# Patient Record
Sex: Female | Born: 1970
Health system: Southern US, Community
[De-identification: ages and names within clinical notes are randomized; demographics above are authoritative.]

## PROBLEM LIST (undated history)

## (undated) HISTORY — PX: OTHER SURGICAL HISTORY: SHX169

## (undated) HISTORY — PX: CHOLECYSTECTOMY: SHX55

## (undated) HISTORY — PX: ABDOMINAL HYSTERECTOMY: SHX81

---

## 2013-11-08 ENCOUNTER — Other Ambulatory Visit: Payer: Self-pay

## 2013-11-08 DIAGNOSIS — Z1239 Encounter for other screening for malignant neoplasm of breast: Secondary | ICD-10-CM

## 2013-11-10 ENCOUNTER — Ambulatory Visit
Admission: RE | Admit: 2013-11-10 | Discharge: 2013-11-10 | Disposition: A | Discharge status: Home / Self Care | Payer: Managed Care, Other (non HMO) | Source: Ambulatory Visit

## 2013-11-10 ENCOUNTER — Encounter (INDEPENDENT_AMBULATORY_CARE_PROVIDER_SITE_OTHER): Payer: Self-pay

## 2013-11-10 DIAGNOSIS — Z1239 Encounter for other screening for malignant neoplasm of breast: Secondary | ICD-10-CM

## 2013-11-13 ENCOUNTER — Other Ambulatory Visit: Payer: Self-pay | Admitting: Family Medicine

## 2013-11-13 DIAGNOSIS — R928 Other abnormal and inconclusive findings on diagnostic imaging of breast: Secondary | ICD-10-CM

## 2013-12-04 ENCOUNTER — Ambulatory Visit
Admission: RE | Admit: 2013-12-04 | Discharge: 2013-12-04 | Disposition: A | Discharge status: Home / Self Care | Payer: Managed Care, Other (non HMO) | Source: Ambulatory Visit | Attending: Family Medicine | Admitting: Family Medicine

## 2013-12-04 DIAGNOSIS — R928 Other abnormal and inconclusive findings on diagnostic imaging of breast: Secondary | ICD-10-CM

## 2013-12-28 ENCOUNTER — Other Ambulatory Visit (HOSPITAL_COMMUNITY)
Admission: RE | Admit: 2013-12-28 | Discharge: 2013-12-28 | Disposition: A | Discharge status: Home / Self Care | Payer: Managed Care, Other (non HMO) | Source: Ambulatory Visit | Attending: Family Medicine | Admitting: Family Medicine

## 2013-12-28 ENCOUNTER — Other Ambulatory Visit: Payer: Self-pay | Admitting: Family Medicine

## 2013-12-28 DIAGNOSIS — Z1151 Encounter for screening for human papillomavirus (HPV): Secondary | ICD-10-CM | POA: Insufficient documentation

## 2013-12-28 DIAGNOSIS — Z124 Encounter for screening for malignant neoplasm of cervix: Secondary | ICD-10-CM | POA: Diagnosis present

## 2014-01-01 LAB — CYTOLOGY - PAP

## 2015-04-11 ENCOUNTER — Other Ambulatory Visit: Payer: Self-pay

## 2015-04-11 DIAGNOSIS — Z1231 Encounter for screening mammogram for malignant neoplasm of breast: Secondary | ICD-10-CM

## 2015-04-16 ENCOUNTER — Ambulatory Visit
Admission: RE | Admit: 2015-04-16 | Discharge: 2015-04-16 | Disposition: A | Discharge status: Home / Self Care | Payer: Managed Care, Other (non HMO) | Source: Ambulatory Visit

## 2015-04-16 DIAGNOSIS — Z1231 Encounter for screening mammogram for malignant neoplasm of breast: Secondary | ICD-10-CM

## 2016-05-29 ENCOUNTER — Other Ambulatory Visit: Payer: Self-pay | Admitting: Family Medicine

## 2016-05-29 DIAGNOSIS — Z1231 Encounter for screening mammogram for malignant neoplasm of breast: Secondary | ICD-10-CM

## 2016-06-19 ENCOUNTER — Ambulatory Visit: Payer: Managed Care, Other (non HMO)

## 2016-06-29 ENCOUNTER — Ambulatory Visit
Admission: RE | Admit: 2016-06-29 | Discharge: 2016-06-29 | Disposition: A | Discharge status: Home / Self Care | Payer: Managed Care, Other (non HMO) | Source: Ambulatory Visit | Attending: Family Medicine | Admitting: Family Medicine

## 2016-06-29 DIAGNOSIS — Z1231 Encounter for screening mammogram for malignant neoplasm of breast: Secondary | ICD-10-CM

## 2017-01-23 ENCOUNTER — Inpatient Hospital Stay (HOSPITAL_BASED_OUTPATIENT_CLINIC_OR_DEPARTMENT_OTHER)
Admission: EM | Admit: 2017-01-23 | Discharge: 2017-01-30 | DRG: 441 | Disposition: A | Discharge status: Home Health | Payer: BLUE CROSS/BLUE SHIELD | Attending: Internal Medicine | Admitting: Internal Medicine

## 2017-01-23 ENCOUNTER — Emergency Department (HOSPITAL_BASED_OUTPATIENT_CLINIC_OR_DEPARTMENT_OTHER): Payer: BLUE CROSS/BLUE SHIELD

## 2017-01-23 ENCOUNTER — Other Ambulatory Visit: Payer: Self-pay

## 2017-01-23 ENCOUNTER — Encounter (HOSPITAL_BASED_OUTPATIENT_CLINIC_OR_DEPARTMENT_OTHER): Payer: Self-pay | Admitting: *Deleted

## 2017-01-23 DIAGNOSIS — D473 Essential (hemorrhagic) thrombocythemia: Secondary | ICD-10-CM | POA: Diagnosis not present

## 2017-01-23 DIAGNOSIS — R509 Fever, unspecified: Secondary | ICD-10-CM

## 2017-01-23 DIAGNOSIS — R599 Enlarged lymph nodes, unspecified: Secondary | ICD-10-CM | POA: Diagnosis not present

## 2017-01-23 DIAGNOSIS — Z880 Allergy status to penicillin: Secondary | ICD-10-CM

## 2017-01-23 DIAGNOSIS — K831 Obstruction of bile duct: Secondary | ICD-10-CM | POA: Diagnosis present

## 2017-01-23 DIAGNOSIS — Z79899 Other long term (current) drug therapy: Secondary | ICD-10-CM | POA: Diagnosis not present

## 2017-01-23 DIAGNOSIS — G8929 Other chronic pain: Secondary | ICD-10-CM | POA: Diagnosis present

## 2017-01-23 DIAGNOSIS — E876 Hypokalemia: Secondary | ICD-10-CM | POA: Diagnosis not present

## 2017-01-23 DIAGNOSIS — R52 Pain, unspecified: Secondary | ICD-10-CM | POA: Diagnosis not present

## 2017-01-23 DIAGNOSIS — D638 Anemia in other chronic diseases classified elsewhere: Secondary | ICD-10-CM | POA: Diagnosis not present

## 2017-01-23 DIAGNOSIS — D72829 Elevated white blood cell count, unspecified: Secondary | ICD-10-CM | POA: Diagnosis not present

## 2017-01-23 DIAGNOSIS — A498 Other bacterial infections of unspecified site: Secondary | ICD-10-CM | POA: Diagnosis not present

## 2017-01-23 DIAGNOSIS — Z888 Allergy status to other drugs, medicaments and biological substances status: Secondary | ICD-10-CM | POA: Diagnosis not present

## 2017-01-23 DIAGNOSIS — R5383 Other fatigue: Secondary | ICD-10-CM

## 2017-01-23 DIAGNOSIS — D649 Anemia, unspecified: Secondary | ICD-10-CM | POA: Diagnosis not present

## 2017-01-23 DIAGNOSIS — E871 Hypo-osmolality and hyponatremia: Secondary | ICD-10-CM | POA: Diagnosis not present

## 2017-01-23 DIAGNOSIS — F419 Anxiety disorder, unspecified: Secondary | ICD-10-CM | POA: Diagnosis not present

## 2017-01-23 DIAGNOSIS — K750 Abscess of liver: Secondary | ICD-10-CM | POA: Diagnosis not present

## 2017-01-23 DIAGNOSIS — F329 Major depressive disorder, single episode, unspecified: Secondary | ICD-10-CM | POA: Diagnosis present

## 2017-01-23 DIAGNOSIS — R7989 Other specified abnormal findings of blood chemistry: Secondary | ICD-10-CM | POA: Diagnosis present

## 2017-01-23 DIAGNOSIS — E669 Obesity, unspecified: Secondary | ICD-10-CM | POA: Diagnosis present

## 2017-01-23 DIAGNOSIS — L270 Generalized skin eruption due to drugs and medicaments taken internally: Secondary | ICD-10-CM

## 2017-01-23 DIAGNOSIS — Z885 Allergy status to narcotic agent status: Secondary | ICD-10-CM

## 2017-01-23 DIAGNOSIS — K9189 Other postprocedural complications and disorders of digestive system: Secondary | ICD-10-CM | POA: Diagnosis present

## 2017-01-23 DIAGNOSIS — Z6834 Body mass index (BMI) 34.0-34.9, adult: Secondary | ICD-10-CM | POA: Diagnosis not present

## 2017-01-23 DIAGNOSIS — N739 Female pelvic inflammatory disease, unspecified: Secondary | ICD-10-CM | POA: Diagnosis not present

## 2017-01-23 DIAGNOSIS — Z881 Allergy status to other antibiotic agents status: Secondary | ICD-10-CM | POA: Diagnosis not present

## 2017-01-23 DIAGNOSIS — K219 Gastro-esophageal reflux disease without esophagitis: Secondary | ICD-10-CM | POA: Diagnosis present

## 2017-01-23 DIAGNOSIS — K769 Liver disease, unspecified: Secondary | ICD-10-CM | POA: Diagnosis not present

## 2017-01-23 DIAGNOSIS — Z90710 Acquired absence of both cervix and uterus: Secondary | ICD-10-CM

## 2017-01-23 DIAGNOSIS — K760 Fatty (change of) liver, not elsewhere classified: Secondary | ICD-10-CM | POA: Diagnosis not present

## 2017-01-23 DIAGNOSIS — Z9889 Other specified postprocedural states: Secondary | ICD-10-CM | POA: Diagnosis not present

## 2017-01-23 LAB — URINALYSIS, ROUTINE W REFLEX MICROSCOPIC
Glucose, UA: NEGATIVE mg/dL
KETONES UR: NEGATIVE mg/dL
Leukocytes, UA: NEGATIVE
NITRITE: NEGATIVE
PROTEIN: 30 mg/dL — AB
Specific Gravity, Urine: 1.01 (ref 1.005–1.030)
pH: 6 (ref 5.0–8.0)

## 2017-01-23 LAB — CBC WITH DIFFERENTIAL/PLATELET
Basophils Absolute: 0 10*3/uL (ref 0.0–0.1)
Basophils Relative: 0 %
EOS PCT: 0 %
Eosinophils Absolute: 0 10*3/uL (ref 0.0–0.7)
HEMATOCRIT: 27.4 % — AB (ref 36.0–46.0)
HEMOGLOBIN: 9.5 g/dL — AB (ref 12.0–15.0)
LYMPHS PCT: 13 %
Lymphs Abs: 2.6 10*3/uL (ref 0.7–4.0)
MCH: 28.5 pg (ref 26.0–34.0)
MCHC: 34.7 g/dL (ref 30.0–36.0)
MCV: 82.3 fL (ref 78.0–100.0)
MONOS PCT: 6 %
Monocytes Absolute: 1.2 10*3/uL — ABNORMAL HIGH (ref 0.1–1.0)
NEUTROS ABS: 16.4 10*3/uL — AB (ref 1.7–7.7)
Neutrophils Relative %: 81 %
Platelets: 383 10*3/uL (ref 150–400)
RBC: 3.33 MIL/uL — ABNORMAL LOW (ref 3.87–5.11)
RDW: 13.3 % (ref 11.5–15.5)
WBC: 20.2 10*3/uL — ABNORMAL HIGH (ref 4.0–10.5)

## 2017-01-23 LAB — COMPREHENSIVE METABOLIC PANEL
ALK PHOS: 119 U/L (ref 38–126)
ALT: 71 U/L — ABNORMAL HIGH (ref 14–54)
AST: 68 U/L — AB (ref 15–41)
Albumin: 2.2 g/dL — ABNORMAL LOW (ref 3.5–5.0)
Anion gap: 12 (ref 5–15)
BUN: 13 mg/dL (ref 6–20)
CALCIUM: 7.6 mg/dL — AB (ref 8.9–10.3)
CO2: 22 mmol/L (ref 22–32)
Chloride: 98 mmol/L — ABNORMAL LOW (ref 101–111)
Creatinine, Ser: 0.65 mg/dL (ref 0.44–1.00)
GFR calc Af Amer: >60 mL/min (ref >60)
GFR calc non Af Amer: >60 mL/min (ref >60)
GLUCOSE: 86 mg/dL (ref 65–99)
Potassium: 2.9 mmol/L — ABNORMAL LOW (ref 3.5–5.1)
Sodium: 132 mmol/L — ABNORMAL LOW (ref 135–145)
Total Bilirubin: 2.6 mg/dL — ABNORMAL HIGH (ref 0.3–1.2)
Total Protein: 7 g/dL (ref 6.5–8.1)

## 2017-01-23 LAB — URINALYSIS, MICROSCOPIC (REFLEX)

## 2017-01-23 LAB — LIPASE, BLOOD: Lipase: 46 U/L (ref 11–51)

## 2017-01-23 MED ORDER — METRONIDAZOLE IN NACL 5-0.79 MG/ML-% IV SOLN
500.0000 mg | Freq: Once | INTRAVENOUS | Status: AC
  Administered 2017-01-24: 500 mg via INTRAVENOUS
  Filled 2017-01-23: qty 100

## 2017-01-23 MED ORDER — DEXTROSE 5 % IV SOLN
2.0000 g | Freq: Once | INTRAVENOUS | Status: AC
  Administered 2017-01-23: 2 g via INTRAVENOUS
  Filled 2017-01-23: qty 2

## 2017-01-23 MED ORDER — ACETAMINOPHEN 325 MG PO TABS
650.0000 mg | ORAL_TABLET | Freq: Once | ORAL | Status: AC
  Administered 2017-01-23: 650 mg via ORAL
  Filled 2017-01-23: qty 2

## 2017-01-23 MED ORDER — SODIUM CHLORIDE 0.9 % IV BOLUS (SEPSIS)
1000.0000 mL | Freq: Once | INTRAVENOUS | Status: AC
  Administered 2017-01-23: 1000 mL via INTRAVENOUS

## 2017-01-23 MED ORDER — POTASSIUM CHLORIDE 10 MEQ/100ML IV SOLN
10.0000 meq | INTRAVENOUS | Status: AC
  Administered 2017-01-24 (×3): 10 meq via INTRAVENOUS
  Filled 2017-01-23 (×3): qty 100

## 2017-01-23 MED ORDER — IOPAMIDOL (ISOVUE-300) INJECTION 61%
100.0000 mL | Freq: Once | INTRAVENOUS | Status: AC | PRN
  Administered 2017-01-23: 100 mL via INTRAVENOUS

## 2017-01-23 NOTE — ED Notes (Signed)
Patient transported to CT 

## 2017-01-23 NOTE — ED Triage Notes (Signed)
Patient states she has a two week history of abdominal pain, decreased appetite, increased thirst, increased urination and fatigue.Also she has had periods of extreme sweats followed by extreme cold and a non productive cough. Recently was on a cruise to [place].  States twenty years ago she had her gallbladder removed and her common bile duct stapled.  Had to have it rebuild at [facility].  States she has had episodes like this over the years, none in five years, and usually is associated with stress.  States she has a new job and it is stressful.

## 2017-01-23 NOTE — ED Notes (Signed)
Patient ambulatory to the BR without difficulty 

## 2017-01-23 NOTE — ED Provider Notes (Signed)
[facility] EMERGENCY DEPARTMENT Provider Note   CSN: [id number] Arrival date & time: 01/23/17  1844     History   Chief Complaint Chief Complaint  Patient presents with  . Fatigue    HPI Molly Baker is a 46 y.o. female.  HPI   Reports hx of abdominal cramping with stress since she had biliary tube rebuilt. Last night and the night before had an attack. Usually it goes away and feels better but this time had shivers and sweating.  Has new job, thinks it is part of the stress.  Went on vacation last week and it has progressively not been getting better. Reports severe fatigue. Stomach feels nauseas, fruit, water, lemon ice for a few days.  Ate a meal yesterday but it was not much.  It's just not getting better.  Pain is epigastric, like "punching", now when eating it shoots bilaterally.  Pain is not severe but feeling.  Urine is yellow-orange, frequent urination.  Shivers improved now.    Started to have cough in back of the throat.  Feels like tickle in the back of the throat, not like a true cough.     Fever in ED, not sure how long has had it, chills have been going on for the last 2 weeks. Biggest symptom today is fatigue, general feeling of unwell. Epigastric discomfort.  Had vomiting prior but not recently. No diarrhea.    History reviewed. No pertinent past medical history.  Patient Active Problem List   Diagnosis Date Noted  . Hepatic abscess 01/23/2017    Past Surgical History:  Procedure Laterality Date  . ABDOMINAL HYSTERECTOMY     partial  . CHOLECYSTECTOMY    . common bile duct reconstruction      OB History    No data available       Home Medications    Prior to Admission medications   Medication Sig Start Date End Date Taking? Authorizing Provider  buPROPion (WELLBUTRIN XL) 300 MG 24 hr tablet Take 300 mg by mouth daily.   Yes [provider]  diphenhydrAMINE (BENADRYL) 25 MG tablet Take 25 mg by mouth every 6 (six) hours as  needed.   Yes [provider]  esomeprazole (NEXIUM) 40 MG capsule Take 40 mg by mouth daily at 12 noon.   Yes [provider]  ibuprofen (ADVIL,MOTRIN) 200 MG tablet Take 200 mg by mouth every 6 (six) hours as needed.   Yes [provider]    Family History Family History  Problem Relation Age of Onset  . Breast cancer Maternal Grandmother     Social History Social History   Tobacco Use  . Smoking status: Never Smoker  . Smokeless tobacco: Never Used  Substance Use Topics  . Alcohol use: Yes    Comment: occassionally  . Drug use: No     Allergies   Ciprofloxacin; Codeine; Dilaudid [hydromorphone hcl]; Morphine and related; Penicillins; Percocet [oxycodone-acetaminophen]; Phenergan [promethazine hcl]; and Zosyn [piperacillin-tazobactam in dex]   Review of Systems Review of Systems  Constitutional: Positive for appetite change, chills, fatigue and fever.  HENT: Negative for sore throat.   Eyes: Negative for visual disturbance.  Respiratory: Positive for cough (mild more like tickle in the throat). Negative for shortness of breath.   Cardiovascular: Negative for chest pain.  Gastrointestinal: Positive for abdominal pain and nausea. Negative for constipation, diarrhea and vomiting (none recently, had before).  Genitourinary: Negative for difficulty urinating.  Musculoskeletal: Negative for back pain and neck  pain.  Skin: Negative for rash.  Neurological: Negative for syncope and headaches.     Physical Exam Updated Vital Signs BP (!) 110/56   Pulse 82   Temp 98.1 F (36.7 C) (Oral)   Resp 18   Ht 5\' 4"  (1.626 m)   Wt 90.7 kg (200 lb)   LMP 04/03/2015   SpO2 94%   BMI 34.33 kg/m   Physical Exam  Constitutional: She is oriented to person, place, and time. She appears well-developed and well-nourished. No distress.  HENT:  Head: Normocephalic and atraumatic.  Eyes: Conjunctivae and EOM are normal.  Neck: Normal range of motion.    Cardiovascular: Normal rate, regular rhythm, normal heart sounds and intact distal pulses. Exam reveals no gallop and no friction rub.  No murmur heard. Pulmonary/Chest: Effort normal and breath sounds normal. No respiratory distress. She has no wheezes. She has no rales.  Abdominal: Soft. She exhibits no distension. There is tenderness (mild epigastric). There is no guarding.  Musculoskeletal: She exhibits no edema or tenderness.  Neurological: She is alert and oriented to person, place, and time.  Skin: Skin is warm and dry. No rash noted. She is not diaphoretic. No erythema.  Nursing note and vitals reviewed.    ED Treatments / Results  Labs (all labs ordered are listed, but only abnormal results are displayed) Labs Reviewed  URINALYSIS, ROUTINE W REFLEX MICROSCOPIC - Abnormal; Notable for the following components:      Result Value   Color, Urine AMBER (*)    APPearance HAZY (*)    Hgb urine dipstick LARGE (*)    Bilirubin Urine MODERATE (*)    Protein, ur 30 (*)    All other components within normal limits  URINALYSIS, MICROSCOPIC (REFLEX) - Abnormal; Notable for the following components:   Bacteria, UA MANY (*)    Squamous Epithelial / LPF 0-5 (*)    All other components within normal limits  CBC WITH DIFFERENTIAL/PLATELET - Abnormal; Notable for the following components:   WBC 20.2 (*)    RBC 3.33 (*)    Hemoglobin 9.5 (*)    HCT 27.4 (*)    Neutro Abs 16.4 (*)    Monocytes Absolute 1.2 (*)    All other components within normal limits  COMPREHENSIVE METABOLIC PANEL - Abnormal; Notable for the following components:   Sodium 132 (*)    Potassium 2.9 (*)    Chloride 98 (*)    Calcium 7.6 (*)    Albumin 2.2 (*)    AST 68 (*)    ALT 71 (*)    Total Bilirubin 2.6 (*)    All other components within normal limits  LIPASE, BLOOD  MAGNESIUM    EKG  EKG Interpretation None       Radiology Ct Abdomen Pelvis W Contrast  Result Date: 01/23/2017 CLINICAL DATA:   46 year old female with epigastric pain and fever. Prior cholecystectomy. EXAM: CT ABDOMEN AND PELVIS WITH CONTRAST TECHNIQUE: Multidetector CT imaging of the abdomen and pelvis was performed using the standard protocol following bolus administration of intravenous contrast. CONTRAST:  164mL ISOVUE-300 IOPAMIDOL (ISOVUE-300) INJECTION 61% COMPARISON:  None. FINDINGS: Lower chest: There is mild eventration of the right hemidiaphragm with right lung base atelectatic changes. The left lung base is clear. No intra-abdominal free air or free fluid. Hepatobiliary: There is diffuse fatty infiltration of the liver. The liver is enlarged measuring 18 cm in midclavicular length. There is an 11 x 11 cm complex mass with cystic or necrotic  areas in the right lobe of the liver with mild surrounding edema. There is associated mass effect and displacement of the adjacent liver parenchyma and vasculature. This lesion is not well characterized but may represent a neoplasm or an infectious process/abscess. Correlation with clinical exam and further evaluation with dedicated MRI without and with contrast recommended. There is postsurgical changes of cholecystectomy. There is pneumobilia. Pancreas: Unremarkable. No pancreatic ductal dilatation or surrounding inflammatory changes. Spleen: Normal in size without focal abnormality. Adrenals/Urinary Tract: The adrenal glands are unremarkable. Amorphous calcification of the inferior pole of the right kidney measuring 10 mm in diameter. There is no hydronephrosis on either side. The visualized ureters and urinary bladder appear unremarkable. Stomach/Bowel: No bowel obstruction or active inflammation. The appendix is not visualized with certainty. No inflammatory changes identified in the right lower quadrant. Vascular/Lymphatic: No significant vascular findings are present. No enlarged abdominal or pelvic lymph nodes. Reproductive: Hysterectomy. No pelvic mass. The ovaries are unremarkable.  Other: None Musculoskeletal: No acute or significant osseous findings. IMPRESSION: 1. Large complex on mass in the right lobe of the liver with mild surrounding edema. This may represent a malignancy or an infectious process/abscess. Further evaluation with MRI recommended. 2. Hepatomegaly with fatty infiltration of the liver. 3. Cholecystectomy with pneumobilia. 4. No bowel obstruction or active inflammation. 5. Focus of amorphous calcific density in the inferior pole of the right kidney. Electronically Signed   By: Anner Crete M.D.   On: 01/23/2017 22:28    Procedures Procedures (including critical care time)  Medications Ordered in ED Medications  sodium chloride 0.9 % bolus 1,000 mL (0 mLs Intravenous Stopped 01/23/17 2245)  acetaminophen (TYLENOL) tablet 650 mg (650 mg Oral Given 01/23/17 2132)  iopamidol (ISOVUE-300) 61 % injection 100 mL (100 mLs Intravenous Contrast Given 01/23/17 2148)  cefTRIAXone (ROCEPHIN) 2 g in dextrose 5 % 50 mL IVPB (0 g Intravenous Stopped 01/24/17 0047)  metroNIDAZOLE (FLAGYL) IVPB 500 mg (0 mg Intravenous Stopped 01/24/17 0150)  potassium chloride 10 mEq in 100 mL IVPB (0 mEq Intravenous Stopped 01/24/17 0245)     Initial Impression / Assessment and Plan / ED Course  I have reviewed the triage vital signs and the nursing notes.  Pertinent labs & imaging results that were available during my care of the patient were reviewed by me and considered in my medical decision making (see chart for details).    46 year old female with a history of cholecystectomy, after which she required biliary reconstruction, who presents with concern for 2 weeks of epigastric discomfort, chills, and severe fatigue.  Patient febrile to 102 on arrival to the emergency department.  Labs significant for leukocytosis, mild elevation in transaminases.  Urinalysis shows red blood cells, many bacteria, however no leukocytosis.  CT abdomen and pelvis completed which shows large  complex mass in the right lobe of the liver with surrounding edema, measuring approximately 11 x 11 cm.  In setting of fever and leukocytosis, suspect this likely represents abscess and infection, however malignancy is also in differential by CT appearance.  Given rocephin and flagyl.  Initial plan for admission to [facility], given no beds available at [facility] and spoke with Dr. Shanon Brow.  Later, found that no MedSurg beds available at [facility], and patient will be admitted to Dr. Alcario Drought at [facility].  She is hemodynamically stable.   Final Clinical Impressions(s) / ED Diagnoses   Final diagnoses:  Hepatic abscess  Fever, unspecified fever cause  Other fatigue    ED Discharge  Orders    None       Gareth Morgan, MD 01/24/17 [phone]

## 2017-01-23 NOTE — ED Notes (Signed)
ED Provider at bedside. 

## 2017-01-23 NOTE — ED Notes (Signed)
Patient presents stating she has been having pain to her upper abd under her breasts.  Has had numerous surgeries to he bile duct.  States this pain has been going on for 1 1/2 weeks but was on a cruise.  Just got into the airport today and came straight here to be seen.

## 2017-01-24 DIAGNOSIS — Z9889 Other specified postprocedural states: Secondary | ICD-10-CM

## 2017-01-24 DIAGNOSIS — R509 Fever, unspecified: Secondary | ICD-10-CM

## 2017-01-24 LAB — HIV ANTIBODY (ROUTINE TESTING W REFLEX): HIV SCREEN 4TH GENERATION: NONREACTIVE

## 2017-01-24 LAB — MAGNESIUM: Magnesium: 2.2 mg/dL (ref 1.7–2.4)

## 2017-01-24 MED ORDER — DEXTROSE 5 % IV SOLN: 2.0000 g | INTRAVENOUS | Status: DC

## 2017-01-24 MED ORDER — ACETAMINOPHEN 650 MG RE SUPP: 650.0000 mg | Freq: Four times a day (QID) | RECTAL | Status: DC | PRN

## 2017-01-24 MED ORDER — PANTOPRAZOLE SODIUM 40 MG PO TBEC
80.0000 mg | DELAYED_RELEASE_TABLET | Freq: Every day | ORAL | Status: DC
  Administered 2017-01-24 – 2017-01-29 (×6): 80 mg via ORAL
  Filled 2017-01-24 (×6): qty 2

## 2017-01-24 MED ORDER — METHYLPREDNISOLONE SODIUM SUCC 125 MG IJ SOLR
125.0000 mg | Freq: Once | INTRAMUSCULAR | Status: AC
  Administered 2017-01-24: 125 mg via INTRAVENOUS
  Filled 2017-01-24: qty 2

## 2017-01-24 MED ORDER — POTASSIUM CHLORIDE CRYS ER 20 MEQ PO TBCR
20.0000 meq | EXTENDED_RELEASE_TABLET | Freq: Once | ORAL | Status: AC
  Administered 2017-01-24: 20 meq via ORAL
  Filled 2017-01-24: qty 1

## 2017-01-24 MED ORDER — METRONIDAZOLE IN NACL 5-0.79 MG/ML-% IV SOLN
500.0000 mg | Freq: Three times a day (TID) | INTRAVENOUS | Status: DC
  Administered 2017-01-24: 500 mg via INTRAVENOUS
  Filled 2017-01-24 (×2): qty 100

## 2017-01-24 MED ORDER — ACETAMINOPHEN 325 MG PO TABS
650.0000 mg | ORAL_TABLET | Freq: Four times a day (QID) | ORAL | Status: DC | PRN
  Administered 2017-01-24 – 2017-01-27 (×2): 650 mg via ORAL
  Filled 2017-01-24 (×2): qty 2

## 2017-01-24 MED ORDER — MEROPENEM 1 G IV SOLR
1.0000 g | Freq: Three times a day (TID) | INTRAVENOUS | Status: DC
  Administered 2017-01-24 – 2017-01-28 (×13): 1 g via INTRAVENOUS
  Filled 2017-01-24 (×13): qty 1

## 2017-01-24 MED ORDER — ONDANSETRON HCL 4 MG PO TABS: 4.0000 mg | ORAL_TABLET | Freq: Four times a day (QID) | ORAL | Status: DC | PRN

## 2017-01-24 MED ORDER — DIPHENHYDRAMINE HCL 25 MG PO CAPS
25.0000 mg | ORAL_CAPSULE | ORAL | Status: DC | PRN
  Administered 2017-01-24: 25 mg via ORAL
  Administered 2017-01-27 – 2017-01-29 (×7): 50 mg via ORAL
  Filled 2017-01-24 (×3): qty 2
  Filled 2017-01-24: qty 1
  Filled 2017-01-24 (×3): qty 2

## 2017-01-24 MED ORDER — ONDANSETRON HCL 4 MG/2ML IJ SOLN: 4.0000 mg | Freq: Four times a day (QID) | INTRAMUSCULAR | Status: DC | PRN

## 2017-01-24 MED ORDER — SODIUM CHLORIDE 0.9 % IV SOLN
INTRAVENOUS | Status: DC
  Administered 2017-01-24 – 2017-01-25 (×2): via INTRAVENOUS

## 2017-01-24 MED ORDER — IBUPROFEN 200 MG PO TABS: 200.0000 mg | ORAL_TABLET | Freq: Four times a day (QID) | ORAL | Status: DC | PRN

## 2017-01-24 MED ORDER — FAMOTIDINE 20 MG PO TABS
40.0000 mg | ORAL_TABLET | Freq: Once | ORAL | Status: AC
  Administered 2017-01-24: 40 mg via ORAL
  Filled 2017-01-24: qty 2

## 2017-01-24 MED ORDER — BUPROPION HCL ER (XL) 300 MG PO TB24
300.0000 mg | ORAL_TABLET | Freq: Every day | ORAL | Status: DC
  Administered 2017-01-24 – 2017-01-30 (×6): 300 mg via ORAL
  Filled 2017-01-24 (×6): qty 1

## 2017-01-24 NOTE — H&P (Signed)
History and Physical    Molly Baker XTG:[id number] DOB: 01-Jan-1971 DOA: 01/23/2017  PCP: Hulan Fess, MD  Patient coming from: Home  I have personally briefly reviewed patient's old medical records in [place]  Chief Complaint: Fatigue, fever  HPI: Molly Baker is a 46 y.o. female with medical history significant of cholecystectomy some 20 years ago, complicated by injury to CBD requiring CBD reconstruction surgery at that time.  She has a history of abd cramping with stress since she had biliary tube rebuilt.  A few weeks ago, prior to going on vacation to the [place], this began to get worse.  Associated "hot and cold flashes".  She attributed it to stress, went on the cruise, but symptoms continued through the cruise and after.  Last night and the night before had attacks but with shivers and sweating.  Now presents to ED at [facility] for worsening, intermittent, symptoms.   ED Course: Tm 102.1 WBC 20.2k, HGB 9.5k.  K 2.9.  CT abd pelvis demonstrates Large complex on mass in the right lobe of the liver with mild surrounding edema. This may represent a malignancy or an infectious process/abscess. Further evaluation with MRI recommended.   Review of Systems: As per HPI otherwise 10 point review of systems negative.   History reviewed. No pertinent past medical history.  Past Surgical History:  Procedure Laterality Date  . ABDOMINAL HYSTERECTOMY     partial  . CHOLECYSTECTOMY    . common bile duct reconstruction       reports that  has never smoked. she has never used smokeless tobacco. She reports that she drinks alcohol. She reports that she does not use drugs.  Allergies  Allergen Reactions  . Ciprofloxacin Anaphylaxis  . Codeine Anaphylaxis  . Dilaudid [Hydromorphone Hcl] Anaphylaxis  . Morphine And Related Anaphylaxis  . Penicillins Anaphylaxis    Has patient had a PCN reaction causing immediate rash, facial/tongue/throat swelling, SOB or  lightheadedness with hypotension: yes Has patient had a PCN reaction causing severe rash involving mucus membranes or skin necrosis: no Has patient had a PCN reaction that required hospitalization: yes Has patient had a PCN reaction occurring within the last 10 years: no If all of the above answers are "NO", then may proceed with Cephalosporin use.   Marland Kitchen Percocet [Oxycodone-Acetaminophen] Anaphylaxis  . Phenergan [Promethazine Hcl] Anaphylaxis  . Zosyn [Piperacillin-Tazobactam In Dex] Anaphylaxis    Family History  Problem Relation Age of Onset  . Breast cancer Maternal Grandmother      Prior to Admission medications   Medication Sig Start Date End Date Taking? Authorizing Provider  buPROPion (WELLBUTRIN XL) 300 MG 24 hr tablet Take 300 mg by mouth daily after breakfast.    Yes [provider]  diphenhydrAMINE (BENADRYL) 25 MG tablet Take 25 mg by mouth every 6 (six) hours as needed for allergies.    Yes [provider]  esomeprazole (NEXIUM) 20 MG capsule Take 40 mg by mouth at bedtime.   Yes [provider]  ibuprofen (ADVIL,MOTRIN) 200 MG tablet Take 200 mg by mouth every 6 (six) hours as needed for headache, mild pain or moderate pain.    Yes [provider]    Physical Exam: Vitals:   01/24/17 0200 01/24/17 0300 01/24/17 0406 01/24/17 0446  BP: (!) 110/56  117/69 125/73  Pulse: 82 83  94  Resp:    17  Temp:    (!) 100.6 F (38.1 C)  TempSrc:    Oral  SpO2: 94% 98%  98%  Weight:      Height:        Constitutional: NAD, calm, comfortable Eyes: PERRL, lids and conjunctivae normal ENMT: Mucous membranes are moist. Posterior pharynx clear of any exudate or lesions.Normal dentition.  Neck: normal, supple, no masses, no thyromegaly Respiratory: clear to auscultation bilaterally, no wheezing, no crackles. Normal respiratory effort. No accessory muscle use.  Cardiovascular: Regular rate and rhythm, no murmurs / rubs / gallops. No extremity  edema. 2+ pedal pulses. No carotid bruits.  Abdomen: no tenderness, no masses palpated. No hepatosplenomegaly. Bowel sounds positive.  Musculoskeletal: no clubbing / cyanosis. No joint deformity upper and lower extremities. Good ROM, no contractures. Normal muscle tone.  Skin: no rashes, lesions, ulcers. No induration Neurologic: CN 2-12 grossly intact. Sensation intact, DTR normal. Strength 5/5 in all 4.  Psychiatric: Normal judgment and insight. Alert and oriented x 3. Normal mood.    Labs on Admission: I have personally reviewed following labs and imaging studies  CBC: Recent Labs  Lab 01/23/17 2024  WBC 20.2*  NEUTROABS 16.4*  HGB 9.5*  HCT 27.4*  MCV 82.3  PLT 161   Basic Metabolic Panel: Recent Labs  Lab 01/23/17 2024  NA 132*  K 2.9*  CL 98*  CO2 22  GLUCOSE 86  BUN 13  CREATININE 0.65  CALCIUM 7.6*  MG 2.2   GFR: Estimated Creatinine Clearance: 94.8 mL/min (by C-G formula based on SCr of 0.65 mg/dL). Liver Function Tests: Recent Labs  Lab 01/23/17 2024  AST 68*  ALT 71*  ALKPHOS 119  BILITOT 2.6*  PROT 7.0  ALBUMIN 2.2*   Recent Labs  Lab 01/23/17 2024  LIPASE 46   No results for input(s): AMMONIA in the last 168 hours. Coagulation Profile: No results for input(s): INR, PROTIME in the last 168 hours. Cardiac Enzymes: No results for input(s): CKTOTAL, CKMB, CKMBINDEX, TROPONINI in the last 168 hours. BNP (last 3 results) No results for input(s): PROBNP in the last 8760 hours. HbA1C: No results for input(s): HGBA1C in the last 72 hours. CBG: No results for input(s): GLUCAP in the last 168 hours. Lipid Profile: No results for input(s): CHOL, HDL, LDLCALC, TRIG, CHOLHDL, LDLDIRECT in the last 72 hours. Thyroid Function Tests: No results for input(s): TSH, T4TOTAL, FREET4, T3FREE, THYROIDAB in the last 72 hours. Anemia Panel: No results for input(s): VITAMINB12, FOLATE, FERRITIN, TIBC, IRON, RETICCTPCT in the last 72 hours. Urine analysis:      Component Value Date/Time   COLORURINE AMBER (A) 01/23/2017 1921   APPEARANCEUR HAZY (A) 01/23/2017 1921   LABSPEC 1.010 01/23/2017 1921   PHURINE 6.0 01/23/2017 1921   GLUCOSEU NEGATIVE 01/23/2017 1921   HGBUR LARGE (A) 01/23/2017 1921   BILIRUBINUR MODERATE (A) 01/23/2017 1921   KETONESUR NEGATIVE 01/23/2017 1921   PROTEINUR 30 (A) 01/23/2017 1921   NITRITE NEGATIVE 01/23/2017 1921   LEUKOCYTESUR NEGATIVE 01/23/2017 1921    Radiological Exams on Admission: Ct Abdomen Pelvis W Contrast  Result Date: 01/23/2017 CLINICAL DATA:  46 year old female with epigastric pain and fever. Prior cholecystectomy. EXAM: CT ABDOMEN AND PELVIS WITH CONTRAST TECHNIQUE: Multidetector CT imaging of the abdomen and pelvis was performed using the standard protocol following bolus administration of intravenous contrast. CONTRAST:  168mL ISOVUE-300 IOPAMIDOL (ISOVUE-300) INJECTION 61% COMPARISON:  None. FINDINGS: Lower chest: There is mild eventration of the right hemidiaphragm with right lung base atelectatic changes. The left lung base is clear. No intra-abdominal free air or free fluid. Hepatobiliary: There is diffuse  fatty infiltration of the liver. The liver is enlarged measuring 18 cm in midclavicular length. There is an 11 x 11 cm complex mass with cystic or necrotic areas in the right lobe of the liver with mild surrounding edema. There is associated mass effect and displacement of the adjacent liver parenchyma and vasculature. This lesion is not well characterized but may represent a neoplasm or an infectious process/abscess. Correlation with clinical exam and further evaluation with dedicated MRI without and with contrast recommended. There is postsurgical changes of cholecystectomy. There is pneumobilia. Pancreas: Unremarkable. No pancreatic ductal dilatation or surrounding inflammatory changes. Spleen: Normal in size without focal abnormality. Adrenals/Urinary Tract: The adrenal glands are unremarkable.  Amorphous calcification of the inferior pole of the right kidney measuring 10 mm in diameter. There is no hydronephrosis on either side. The visualized ureters and urinary bladder appear unremarkable. Stomach/Bowel: No bowel obstruction or active inflammation. The appendix is not visualized with certainty. No inflammatory changes identified in the right lower quadrant. Vascular/Lymphatic: No significant vascular findings are present. No enlarged abdominal or pelvic lymph nodes. Reproductive: Hysterectomy. No pelvic mass. The ovaries are unremarkable. Other: None Musculoskeletal: No acute or significant osseous findings. IMPRESSION: 1. Large complex on mass in the right lobe of the liver with mild surrounding edema. This may represent a malignancy or an infectious process/abscess. Further evaluation with MRI recommended. 2. Hepatomegaly with fatty infiltration of the liver. 3. Cholecystectomy with pneumobilia. 4. No bowel obstruction or active inflammation. 5. Focus of amorphous calcific density in the inferior pole of the right kidney. Electronically Signed   By: Anner Crete M.D.   On: 01/23/2017 22:28    EKG: Independently reviewed.  Assessment/Plan Principal Problem:   Hepatic abscess Active Problems:   History of common bile duct surgery    1. Hepatic lesion - possibly very multi-loculated abscess 1. Rocephin and flagyl 2. Spoke with Dr. Marchia Bond 1. He confirmed recommendation that despite fever, WBC, that we obtain MRI as next step before talking with IR 2. Says it looks very unusual for abscess, and difficult to drain by IR if it is. 3. NPO 4. IVF  DVT prophylaxis: SCDs Code Status: Full Family Communication: No family in room Disposition Plan: Home after admit Consults called: Spoke with Dr. Marchia Bond with radiology on phone Admission status: Admit to inpatient   Etta Quill DO [facility] Pager [phone]  If 7AM-7PM, please contact day team taking care of  patient [email or web address] Password [email or web address]  01/24/2017, 5:28 AM

## 2017-01-24 NOTE — Progress Notes (Addendum)
PROGRESS NOTE  Subjective: Molly Baker is a 46 y.o. female with a history of remote cholecystectomy complicated by CBD injury requiring reconstruction and obesity who presented to the ED with 2 weeks of intermittent shaking chills and abdominal pain initially attributed to chronic intermittent pain brought on by stress. The symptoms continued and worsened in severity over 2 weeks despite being on a cruise in the [place], prompting presentation to the ED when she returned. On arrival she was febrile to 102.62F, with leukocytosis 20.2k and epigastric tenderness. CT demonstrated a large complex mass in the right lobe of the liver with mild surrounding edema. Empiric antibiotics were started for presumed abscess and IR consulted for drainage, but recommended MRI for further characterization. This is pending, she was admitted earlier this morning. On my evaluation she reports no significant change in symptoms, but pain is localized mostly in RUQ, worse with deep palpation. No N/V, but is very hungry having been kept NPO. Husband and son at bedside.   Objective: BP 125/73 (BP Location: Right Arm)   Pulse 94   Temp 98 F (36.7 C) (Oral)   Resp 17   Ht 5\' 4"  (1.626 m)   Wt 90.7 kg (200 lb)   LMP 04/03/2015   SpO2 98%   BMI 34.33 kg/m   Gen: Pleasant, non-toxic 46yo F in bed in no distress.  Pulm: Clear and nonlabored on room air  CV: RRR, no murmur, no JVD, no edema GI: Soft, obese and only minimally tender in epigastrium without palpable mass. No distention, hyperactive bowel sounds Neuro: Alert and oriented. No focal deficits. Skin: No rashes, lesions ulcers at time of evaluation  Assessment & Plan: Complex hepatic lesion: With clinical history and SIRS, suspect this is abscess which developed prior to travel. No RF's like IVDU or known immunocompromise.  - Check HIV - Will check CEA - Tylenol prn fever - Continue antibiotics, will switch ceftriaxone/flagyl to meropenem due to pt developing  hives.  - Monitor blood cultures.  - MRI pending. Would favor IR management (vs. surgical debridement) if possible as her scar tissue from CBD reconstruction is likely significant. Aspiration w/cytology and culture sooner than later could help drive abx decisions.  Allergic reaction: Suspected to CTX with history of anaphylaxis to PCN's. No evidence of airway compromise - Pepcid, benadryl, solumedrol - Change abx as above - Will monitor closely  Vance Gather, MD [facility] Pager [phone] 01/24/2017, 1:48 PM

## 2017-01-24 NOTE — Progress Notes (Signed)
Patient arrived from [facility] via [facility] to room 1520. Patient is alert and oriented x4. Patient has SL site x2 to left arm and left wrist. Patient skin intact, abd scars noted. Has generalized edema to lower legs. Patient states her husband took her belongings home including her wedding ring. Patient oriented to bed use, call bell, enc to call for assist. Will c/t monitor.

## 2017-01-24 NOTE — Progress Notes (Signed)
Called by RN to evaluate redness gradually developing this afternoon mostly on chest and face, feels flushed. This was typical of prior reactions to antibiotics including PCN and zosyn. She denies any trouble breathing, chest pain, pruritus, swelling of face, tongue, throat.   On exam she remains in no distress. Vital signs stable without hypotension, tachycardia, mentating normally. The redness has largely subsided following administration of pepcid, solumedrol. Skin shows no hives, mild but new blanchable erythema to anterior chest. No stridor or adventitious lung sounds. Remains nonlabored.  The patient received ceftriaxone yesterday at 23:49 and flagyl earlier this morning. Favor reaction to cephalosporin w/her allergy list.  - Gave antihistaimine, steroid x1.  - Change to meropenem and continue monitoring very closely.   Vance Gather, MD 01/24/2017, 3:43 PM

## 2017-01-24 NOTE — Progress Notes (Signed)
Pharmacy Antibiotic Note  Molly Baker is a 46 y.o. female admitted on 01/23/2017 with intra-abdominal infecton.  Pharmacy has been consulted for meropenem dosing.  Pt is being transitioned to meropenem after possible allergic reaction to ceftriaxone.  Plan: Meropenem 1 gr IV q8h   Monitor clinical course, renal function, cultures as available   Height: 5\' 4"  (162.6 cm) Weight: 200 lb (90.7 kg) IBW/kg (Calculated) : 54.7  Temp (24hrs), Avg:99.5 F (37.5 C), Min:98 F (36.7 C), Max:102.1 F (38.9 C)  Recent Labs  Lab 01/23/17 2024  WBC 20.2*  CREATININE 0.65    Estimated Creatinine Clearance: 94.8 mL/min (by C-G formula based on SCr of 0.65 mg/dL).    Allergies  Allergen Reactions  . Ciprofloxacin Anaphylaxis  . Codeine Anaphylaxis  . Dilaudid [Hydromorphone Hcl] Anaphylaxis  . Morphine And Related Anaphylaxis  . Penicillins Anaphylaxis    Has patient had a PCN reaction causing immediate rash, facial/tongue/throat swelling, SOB or lightheadedness with hypotension: yes Has patient had a PCN reaction causing severe rash involving mucus membranes or skin necrosis: no Has patient had a PCN reaction that required hospitalization: yes Has patient had a PCN reaction occurring within the last 10 years: no If all of the above answers are "NO", then may proceed with Cephalosporin use.   Marland Kitchen Percocet [Oxycodone-Acetaminophen] Anaphylaxis  . Phenergan [Promethazine Hcl] Anaphylaxis  . Zosyn [Piperacillin-Tazobactam In Dex] Anaphylaxis    Antimicrobials this admission: 12/29 metronidazole >> 12/30 12/29 ceftriaxone >> 12/30 12/30 meropenem >>  Dose adjustments this admission: --  Microbiology results: 12/30 HIV antibody: IP    Thank you for allowing pharmacy to be a part of this patient's care.  Royetta Asal, PharmD, BCPS Pager [phone] 01/24/2017 3:01 PM

## 2017-01-25 ENCOUNTER — Inpatient Hospital Stay (HOSPITAL_COMMUNITY): Payer: BLUE CROSS/BLUE SHIELD

## 2017-01-25 ENCOUNTER — Encounter (HOSPITAL_COMMUNITY): Payer: Self-pay | Admitting: General Surgery

## 2017-01-25 DIAGNOSIS — R52 Pain, unspecified: Secondary | ICD-10-CM

## 2017-01-25 DIAGNOSIS — F329 Major depressive disorder, single episode, unspecified: Secondary | ICD-10-CM

## 2017-01-25 LAB — CBC
HCT: 28 % — ABNORMAL LOW (ref 36.0–46.0)
Hemoglobin: 9.5 g/dL — ABNORMAL LOW (ref 12.0–15.0)
MCH: 28.9 pg (ref 26.0–34.0)
MCHC: 33.9 g/dL (ref 30.0–36.0)
MCV: 85.1 fL (ref 78.0–100.0)
PLATELETS: 519 10*3/uL — AB (ref 150–400)
RBC: 3.29 MIL/uL — AB (ref 3.87–5.11)
RDW: 14.2 % (ref 11.5–15.5)
WBC: 21.6 10*3/uL — ABNORMAL HIGH (ref 4.0–10.5)

## 2017-01-25 LAB — BASIC METABOLIC PANEL
ANION GAP: 6 (ref 5–15)
BUN: 15 mg/dL (ref 6–20)
CALCIUM: 7.9 mg/dL — AB (ref 8.9–10.3)
CO2: 23 mmol/L (ref 22–32)
Chloride: 111 mmol/L (ref 101–111)
Creatinine, Ser: 0.63 mg/dL (ref 0.44–1.00)
GFR calc Af Amer: >60 mL/min (ref >60)
GFR calc non Af Amer: >60 mL/min (ref >60)
GLUCOSE: 92 mg/dL (ref 65–99)
POTASSIUM: 3.3 mmol/L — AB (ref 3.5–5.1)
Sodium: 140 mmol/L (ref 135–145)

## 2017-01-25 LAB — PROTIME-INR
INR: 1.37
Prothrombin Time: 16.8 seconds — ABNORMAL HIGH (ref 11.4–15.2)

## 2017-01-25 LAB — HIV ANTIBODY (ROUTINE TESTING W REFLEX): HIV Screen 4th Generation wRfx: NONREACTIVE

## 2017-01-25 MED ORDER — KETOROLAC TROMETHAMINE 15 MG/ML IJ SOLN
15.0000 mg | Freq: Four times a day (QID) | INTRAMUSCULAR | Status: DC | PRN
  Administered 2017-01-26 – 2017-01-27 (×4): 15 mg via INTRAVENOUS
  Filled 2017-01-25 (×4): qty 1

## 2017-01-25 MED ORDER — FENTANYL CITRATE (PF) 100 MCG/2ML IJ SOLN
INTRAMUSCULAR | Status: AC | PRN
  Administered 2017-01-25 (×5): 50 ug via INTRAVENOUS

## 2017-01-25 MED ORDER — GADOBENATE DIMEGLUMINE 529 MG/ML IV SOLN
20.0000 mL | Freq: Once | INTRAVENOUS | Status: AC | PRN
  Administered 2017-01-25: 20 mL via INTRAVENOUS

## 2017-01-25 MED ORDER — FENTANYL CITRATE (PF) 100 MCG/2ML IJ SOLN
INTRAMUSCULAR | Status: AC
  Filled 2017-01-25: qty 4

## 2017-01-25 MED ORDER — FENTANYL CITRATE (PF) 100 MCG/2ML IJ SOLN
50.0000 ug | INTRAMUSCULAR | Status: DC | PRN
  Administered 2017-01-26 (×3): 50 ug via INTRAVENOUS
  Filled 2017-01-25 (×3): qty 2

## 2017-01-25 MED ORDER — SODIUM CHLORIDE 0.9% FLUSH
5.0000 mL | Freq: Three times a day (TID) | INTRAVENOUS | Status: DC
  Administered 2017-01-25 – 2017-01-30 (×13): 5 mL via INTRAVENOUS

## 2017-01-25 MED ORDER — LIDOCAINE-EPINEPHRINE 1 %-1:100000 IJ SOLN
INTRAMUSCULAR | Status: AC | PRN
  Administered 2017-01-25: 10 mL

## 2017-01-25 MED ORDER — FENTANYL CITRATE (PF) 100 MCG/2ML IJ SOLN
100.0000 ug | INTRAMUSCULAR | Status: DC | PRN
  Administered 2017-01-25 – 2017-01-30 (×7): 100 ug via INTRAVENOUS
  Filled 2017-01-25 (×7): qty 2

## 2017-01-25 MED ORDER — MIDAZOLAM HCL 2 MG/2ML IJ SOLN
INTRAMUSCULAR | Status: AC | PRN
  Administered 2017-01-25 (×4): 1 mg via INTRAVENOUS

## 2017-01-25 MED ORDER — FENTANYL CITRATE (PF) 100 MCG/2ML IJ SOLN
INTRAMUSCULAR | Status: AC
  Filled 2017-01-25: qty 2

## 2017-01-25 MED ORDER — FENTANYL CITRATE (PF) 100 MCG/2ML IJ SOLN
50.0000 ug | Freq: Once | INTRAMUSCULAR | Status: AC
  Administered 2017-01-25: 50 ug via INTRAVENOUS
  Filled 2017-01-25: qty 2

## 2017-01-25 MED ORDER — MIDAZOLAM HCL 2 MG/2ML IJ SOLN
INTRAMUSCULAR | Status: AC
Start: 2017-01-25 — End: 2017-01-25
  Filled 2017-01-25: qty 4

## 2017-01-25 NOTE — Procedures (Signed)
Pre procedural Dx: Hepatic abscess Post procedural Dx: Same  Technically successful CT guided placed of 12 Fr drainage catheter placements into the cranial-medial and inferior-lateral aspect of complex hepatic abscess yielding 200 cc of purulent, slightly blood fluid.    Aspirated sample sent to the laboratory for analysis.    EBL: Minimal  Complications: None immediate  Ronny Bacon, MD Pager #: [phone]

## 2017-01-25 NOTE — Sedation Documentation (Signed)
Patient is resting comfortably. 

## 2017-01-25 NOTE — Consult Note (Signed)
Chief Complaint: liver mass/fluid collection  Referring Physician:Dr. Vance Gather  Supervising Physician: Sandi Mariscal  Patient Status: [facility] - In-pt  HPI: Molly Baker is a 46 y.o. female with a history of a cholecystectomy with CBD injury that required reconstruction over 20 years ago.  Two weeks ago she started having some chills and fevers.  She hasn't really been having any abdominal pain.  Due to persistent chills and sweats, she presented to the ED where she was found to have a WBC of 21K and a CT scan that revealed either a liver lesion or fluid collection.  IR has been asked to evaluate her for bx vs aspiration/drain placement.   Past Medical History: History reviewed. No pertinent past medical history.  Past Surgical History:  Past Surgical History:  Procedure Laterality Date  . ABDOMINAL HYSTERECTOMY     partial  . CHOLECYSTECTOMY    . common bile duct reconstruction      Family History:  Family History  Problem Relation Age of Onset  . Breast cancer Maternal Grandmother     Social History:  reports that  has never smoked. she has never used smokeless tobacco. She reports that she drinks alcohol. She reports that she does not use drugs.  Allergies:  Allergies  Allergen Reactions  . Ciprofloxacin Anaphylaxis  . Codeine Anaphylaxis  . Dilaudid [Hydromorphone Hcl] Anaphylaxis  . Morphine And Related Anaphylaxis  . Penicillins Anaphylaxis    Has patient had a PCN reaction causing immediate rash, facial/tongue/throat swelling, SOB or lightheadedness with hypotension: yes Has patient had a PCN reaction causing severe rash involving mucus membranes or skin necrosis: no Has patient had a PCN reaction that required hospitalization: yes Has patient had a PCN reaction occurring within the last 10 years: no If all of the above answers are "NO", then may proceed with Cephalosporin use.   Marland Kitchen Percocet [Oxycodone-Acetaminophen] Anaphylaxis  . Phenergan [Promethazine Hcl]  Anaphylaxis  . Zosyn [Piperacillin-Tazobactam In Dex] Anaphylaxis    Medications: Medications reviewed in epic  Please HPI for pertinent positives, otherwise complete 10 system ROS negative.  Mallampati Score: MD Evaluation Airway: WNL Heart: WNL Abdomen: WNL Chest/ Lungs: WNL ASA  Classification: 2 Mallampati/Airway Score: Two  Physical Exam: BP 110/83 (BP Location: Right Arm)   Pulse 74   Temp 97.8 F (36.6 C) (Oral)   Resp 18   Ht _0  (1.626 m)   Wt 200 lb (90.7 kg)   LMP 04/03/2015   SpO2 97%   BMI 34.33 kg/m  Body mass index is 34.33 kg/m. General: pleasant, obese white female who is laying in bed in NAD HEENT: head is normocephalic, atraumatic.  Sclera are noninjected.  PERRL.  Ears and nose without any masses or lesions.  Mouth is pink and moist Heart: regular, rate, and rhythm.  Normal s1,s2. No obvious murmurs, gallops, or rubs noted.  Palpable radial and pedal pulses bilaterally Lungs: CTAB, no wheezes, rhonchi, or rales noted.  Respiratory effort nonlabored Abd: soft, NT, ND, +BS, obese, no masses, hernias, or organomegaly Psych: A&Ox3 with an appropriate affect.   Labs: Results for orders placed or performed during the hospital encounter of 01/23/17 (from the past 48 hour(s))  Urinalysis, Routine w reflex microscopic     Status: Abnormal   Collection Time: 01/23/17  7:21 PM  Result Value Ref Range   Color, Urine AMBER (A) YELLOW    Comment: BIOCHEMICALS MAY BE AFFECTED BY COLOR   APPearance HAZY (A) CLEAR   Specific Gravity, Urine  1.010 1.005 - 1.030   pH 6.0 5.0 - 8.0   Glucose, UA NEGATIVE NEGATIVE mg/dL   Hgb urine dipstick LARGE (A) NEGATIVE   Bilirubin Urine MODERATE (A) NEGATIVE   Ketones, ur NEGATIVE NEGATIVE mg/dL   Protein, ur 30 (A) NEGATIVE mg/dL   Nitrite NEGATIVE NEGATIVE   Leukocytes, UA NEGATIVE NEGATIVE  Urinalysis, Microscopic (reflex)     Status: Abnormal   Collection Time: 01/23/17  7:21 PM  Result Value Ref Range   RBC / HPF  6-30 0 - 5 RBC/hpf   WBC, UA 0-5 0 - 5 WBC/hpf   Bacteria, UA MANY (A) NONE SEEN   Squamous Epithelial / LPF 0-5 (A) NONE SEEN   Mucus PRESENT    Hyaline Casts, UA PRESENT    Granular Casts, UA PRESENT   CBC with Differential     Status: Abnormal   Collection Time: 01/23/17  8:24 PM  Result Value Ref Range   WBC 20.2 (H) 4.0 - 10.5 K/uL   RBC 3.33 (L) 3.87 - 5.11 MIL/uL   Hemoglobin 9.5 (L) 12.0 - 15.0 g/dL   HCT 27.4 (L) 36.0 - 46.0 %   MCV 82.3 78.0 - 100.0 fL   MCH 28.5 26.0 - 34.0 pg   MCHC 34.7 30.0 - 36.0 g/dL   RDW 13.3 11.5 - 15.5 %   Platelets 383 150 - 400 K/uL   Neutrophils Relative % 81 %   Lymphocytes Relative 13 %   Monocytes Relative 6 %   Eosinophils Relative 0 %   Basophils Relative 0 %   Neutro Abs 16.4 (H) 1.7 - 7.7 K/uL   Lymphs Abs 2.6 0.7 - 4.0 K/uL   Monocytes Absolute 1.2 (H) 0.1 - 1.0 K/uL   Eosinophils Absolute 0.0 0.0 - 0.7 K/uL   Basophils Absolute 0.0 0.0 - 0.1 K/uL   WBC Morphology TOXIC GRANULATION     Comment: VACUOLATED NEUTROPHILS  Comprehensive metabolic panel     Status: Abnormal   Collection Time: 01/23/17  8:24 PM  Result Value Ref Range   Sodium 132 (L) 135 - 145 mmol/L   Potassium 2.9 (L) 3.5 - 5.1 mmol/L   Chloride 98 (L) 101 - 111 mmol/L   CO2 22 22 - 32 mmol/L   Glucose, Bld 86 65 - 99 mg/dL   BUN 13 6 - 20 mg/dL   Creatinine, Ser 0.65 0.44 - 1.00 mg/dL   Calcium 7.6 (L) 8.9 - 10.3 mg/dL   Total Protein 7.0 6.5 - 8.1 g/dL   Albumin 2.2 (L) 3.5 - 5.0 g/dL   AST 68 (H) 15 - 41 U/L   ALT 71 (H) 14 - 54 U/L   Alkaline Phosphatase 119 38 - 126 U/L   Total Bilirubin 2.6 (H) 0.3 - 1.2 mg/dL   GFR calc non Af Amer >60 >60 mL/min   GFR calc Af Amer >60 >60 mL/min    Comment: (NOTE) The eGFR has been calculated using the CKD EPI equation. This calculation has not been validated in all clinical situations. eGFR's persistently <60 mL/min signify possible Chronic Kidney Disease.    Anion gap 12 5 - 15  Lipase, blood     Status:  None   Collection Time: 01/23/17  8:24 PM  Result Value Ref Range   Lipase 46 11 - 51 U/L  Magnesium     Status: None   Collection Time: 01/23/17  8:24 PM  Result Value Ref Range   Magnesium 2.2 1.7 - 2.4 mg/dL  HIV antibody (Routine Testing)     Status: None   Collection Time: 01/24/17  5:41 AM  Result Value Ref Range   HIV Screen 4th Generation wRfx Non Reactive Non Reactive    Comment: (NOTE) Performed At: [facility] [place], [place] [id number] Rush Farmer MD CB:[id number]   CBC     Status: Abnormal   Collection Time: 01/25/17  6:05 AM  Result Value Ref Range   WBC 21.6 (H) 4.0 - 10.5 K/uL   RBC 3.29 (L) 3.87 - 5.11 MIL/uL   Hemoglobin 9.5 (L) 12.0 - 15.0 g/dL   HCT 28.0 (L) 36.0 - 46.0 %   MCV 85.1 78.0 - 100.0 fL   MCH 28.9 26.0 - 34.0 pg   MCHC 33.9 30.0 - 36.0 g/dL   RDW 14.2 11.5 - 15.5 %   Platelets 519 (H) 150 - 400 K/uL  Basic metabolic panel     Status: Abnormal   Collection Time: 01/25/17  9:25 AM  Result Value Ref Range   Sodium 140 135 - 145 mmol/L   Potassium 3.3 (L) 3.5 - 5.1 mmol/L   Chloride 111 101 - 111 mmol/L   CO2 23 22 - 32 mmol/L   Glucose, Bld 92 65 - 99 mg/dL   BUN 15 6 - 20 mg/dL   Creatinine, Ser 0.63 0.44 - 1.00 mg/dL   Calcium 7.9 (L) 8.9 - 10.3 mg/dL   GFR calc non Af Amer >60 >60 mL/min   GFR calc Af Amer >60 >60 mL/min    Comment: (NOTE) The eGFR has been calculated using the CKD EPI equation. This calculation has not been validated in all clinical situations. eGFR's persistently <60 mL/min signify possible Chronic Kidney Disease.    Anion gap 6 5 - 15  Protime-INR     Status: Abnormal   Collection Time: 01/25/17  9:25 AM  Result Value Ref Range   Prothrombin Time 16.8 (H) 11.4 - 15.2 seconds   INR 1.37     Imaging: Ct Abdomen Pelvis W Contrast  Result Date: 01/23/2017 CLINICAL DATA:  46 year old female with epigastric pain and fever. Prior cholecystectomy. EXAM: CT ABDOMEN AND PELVIS WITH  CONTRAST TECHNIQUE: Multidetector CT imaging of the abdomen and pelvis was performed using the standard protocol following bolus administration of intravenous contrast. CONTRAST:  161m ISOVUE-300 IOPAMIDOL (ISOVUE-300) INJECTION 61% COMPARISON:  None. FINDINGS: Lower chest: There is mild eventration of the right hemidiaphragm with right lung base atelectatic changes. The left lung base is clear. No intra-abdominal free air or free fluid. Hepatobiliary: There is diffuse fatty infiltration of the liver. The liver is enlarged measuring 18 cm in midclavicular length. There is an 11 x 11 cm complex mass with cystic or necrotic areas in the right lobe of the liver with mild surrounding edema. There is associated mass effect and displacement of the adjacent liver parenchyma and vasculature. This lesion is not well characterized but may represent a neoplasm or an infectious process/abscess. Correlation with clinical exam and further evaluation with dedicated MRI without and with contrast recommended. There is postsurgical changes of cholecystectomy. There is pneumobilia. Pancreas: Unremarkable. No pancreatic ductal dilatation or surrounding inflammatory changes. Spleen: Normal in size without focal abnormality. Adrenals/Urinary Tract: The adrenal glands are unremarkable. Amorphous calcification of the inferior pole of the right kidney measuring 10 mm in diameter. There is no hydronephrosis on either side. The visualized ureters and urinary bladder appear unremarkable. Stomach/Bowel: No bowel obstruction or active inflammation. The appendix is not visualized  with certainty. No inflammatory changes identified in the right lower quadrant. Vascular/Lymphatic: No significant vascular findings are present. No enlarged abdominal or pelvic lymph nodes. Reproductive: Hysterectomy. No pelvic mass. The ovaries are unremarkable. Other: None Musculoskeletal: No acute or significant osseous findings. IMPRESSION: 1. Large complex on mass  in the right lobe of the liver with mild surrounding edema. This may represent a malignancy or an infectious process/abscess. Further evaluation with MRI recommended. 2. Hepatomegaly with fatty infiltration of the liver. 3. Cholecystectomy with pneumobilia. 4. No bowel obstruction or active inflammation. 5. Focus of amorphous calcific density in the inferior pole of the right kidney. Electronically Signed   By: Anner Crete M.D.   On: 01/23/2017 22:28    Assessment/Plan 1. Liver fluid collection/lesion  The patient is down for an MRI currently.  This will help give more information regarding what is going on.  IR will then plan to proceed with image guided biopsy vs aspiration/drain placement pending what is found during the procedure.  The patient's labs and vitals have been reviewed. Risks and benefits discussed with the patient including, but not limited to bleeding, infection, damage to adjacent structures or low yield requiring additional tests. All of the patient's questions were answered, patient is agreeable to proceed. Consent signed and in chart. Risks and benefits discussed with the patient including bleeding, infection, damage to adjacent structures, bowel perforation/fistula connection, and sepsis. All of the patient's questions were answered, patient is agreeable to proceed. Consent signed and in chart.  Thank you for this interesting consult.  I greatly enjoyed meeting Jandy Brackens and look forward to participating in their care.  A copy of this report was sent to the requesting provider on this date.  Electronically Signed: Henreitta Cea 01/25/2017, 10:58 AM   I spent a total of 40 Minutes  in face to face in clinical consultation, greater than 50% of which was counseling/coordinating care for liver lesion/fluid collection

## 2017-01-25 NOTE — Progress Notes (Signed)
PROGRESS NOTE  Brief Narrative: Molly Baker is a 46 y.o. female with a history of remote cholecystectomy complicated by CBD injury requiring reconstruction and obesity who presented to the ED with 2 weeks of intermittent shaking chills and abdominal pain initially attributed to chronic intermittent pain brought on by stress. The symptoms continued and worsened in severity over 2 weeks despite being on a cruise in the [place], prompting presentation to the ED when she returned. On arrival she was febrile to 102.38F, with leukocytosis 20.2k and epigastric tenderness. CT demonstrated a large complex mass in the right lobe of the liver with mild surrounding edema. Empiric antibiotics were started for presumed abscess and IR consulted for drainage, but recommended MRI for further characterization. Following MRI, Drainage was performed, culture sent, and 2 JP drains left in place 12/31.   Subjective: Seen directly after drains placed by IR today, having severe pain in right abdomen since procedure, constant, nonradiating, worse with deep breathing, improved only modestly by 16mcg fentanyl IV, requesting more medication.   Objective: BP 129/71   Pulse 82   Temp 97.8 F (36.6 C) (Oral)   Resp 14   Ht 5\' 4"  (1.626 m)   Wt 90.7 kg (200 lb)   LMP 04/03/2015   SpO2 96%   BMI 34.33 kg/m   Gen: Uncomfortable, alert 46yo F in bed Pulm: Clear and nonlabored on room air  CV: RRR, no murmur, no JVD, no edema GI: Soft, obese tender diffusely, worst in RUQ. JP drains x2 with sanguinous discharge. + bowel sounds Neuro: Alert and oriented. No focal deficits. Skin: No rashes, lesions ulcers at time of evaluation  Assessment & Plan: Complex hepatic abscess: s/p CT-guided drain placement. No RF's like IVDU or known immunocompromise. HIV NR. CEA pending, though drained fluid 12/31 was purulent consistent with abscess. - Tylenol prn fever - Switched ceftriaxone/flagyl to meropenem due to pt developing hives  12/30. Follow abscess culture drawn 12/31.  - Monitor blood cultures.  - Pain control: Having severe right abdominal pain following procedure, likely related to violation of hepatic capsule, will given fentanyl (has tolerated this) IV q1h prn for dose finding. She was not drowsy after having received 337mcg total fentanyl. Continue toradol prn.  Pericardiophrenic and porta hepatis adenopathy: Probably reactive.  - Recommend post treatment MRI abdomen without and with IV contrast in 3 months   Allergic reaction: 12/30. Suspected to CTX with history of anaphylaxis to PCN's. No evidence of airway compromise - Pepcid, benadryl, solumedrol given, symptoms resolved.  - Changed abx as above  Depression: Chronic, stable - Continue bupropion  GERD: Chronic, stable - Continue PPI  Vance Gather, MD [facility] Pager [phone] 01/25/2017, 1:31 PM

## 2017-01-25 NOTE — Progress Notes (Signed)
[id number]/Rhonda Davis,BSN,RN3,CCM/[phone]/Chart reviewed for cm needs. 

## 2017-01-25 NOTE — Discharge Instructions (Signed)
Surgical Drain Home Care °Surgical drains are used to remove extra fluid that normally builds up in a surgical wound after surgery. A surgical drain helps to heal a surgical wound. Different kinds of surgical drains include: °· Active drains. These drains use suction to pull drainage away from the surgical wound. Drainage flows through a tube to a container outside of the body. It is important to keep the bulb or the drainage container flat (compressed) at all times, except while you empty it. Flattening the bulb or container creates suction. The two most common types of active drains are bulb drains and Hemovac drains. °· Passive drains. These drains allow fluid to drain naturally, by gravity. Drainage flows through a tube to a bandage (dressing) or a container outside of the body. Passive drains do not need to be emptied. The most common type of passive drain is the Penrose drain. ° °A drain is placed during surgery. Immediately after surgery, drainage is usually bright red and a little thicker than water. The drainage may gradually turn yellow or pink and become thinner. It is likely that your health care provider will remove the drain when the drainage stops or when the amount decreases to 1-2 Tbsp (15-30 mL) during a 24-hour period. °How to care for your surgical drain °· Keep the skin around the drain dry and covered with a dressing at all times. °· Check your drain area every day for signs of infection. Check for: °? More redness, swelling, or pain. °? Pus or a bad smell. °? Cloudy drainage. °Follow instructions from your health care provider about how to take care of your drain and how to change your dressing. Change your dressing at least one time every day. Change it more often if needed to keep the dressing dry. Make sure you: °1. Gather your supplies, including: °? Tape. °? Germ-free cleaning solution (sterile saline). °? Split gauze drain sponge: 4 x 4 inches (10 x 10 cm). °? Gauze square: 4 x 4 inches  (10 x 10 cm). °2. Wash your hands with soap and water before you change your dressing. If soap and water are not available, use hand sanitizer. °3. Remove the old dressing. Avoid using scissors to do that. °4. Use sterile saline to clean your skin around the drain. °5. Place the tube through the slit in a drain sponge. Place the drain sponge so that it covers your wound. °6. Place the gauze square or another drain sponge on top of the drain sponge that is on the wound. Make sure the tube is between those layers. °7. Tape the dressing to your skin. °8. If you have an active bulb or Hemovac drain, tape the drainage tube to your skin 1-2 inches (2.5-5 cm) below the place where the tube enters your body. Taping keeps the tube from pulling on any stitches (sutures) that you have. °9. Wash your hands with soap and water. °10. Write down the color of your drainage and how often you change your dressing. ° °How to empty your active bulb or Hemovac drain °1. Make sure that you have a measuring cup that you can empty your drainage into. °2. Wash your hands with soap and water. If soap and water are not available, use hand sanitizer. °3. Gently move your fingers down the tube while squeezing very lightly. This is called stripping the tube. This clears any drainage, clots, or tissue from the tube. °? Do not pull on the tube. °? You may need to strip   the tube several times every day to keep the tube clear. °4. Open the bulb cap or the drain plug. Do not touch the inside of the cap or the bottom of the plug. °5. Empty all of the drainage into the measuring cup. °6. Compress the bulb or the container and replace the cap or the plug. To compress the bulb or the container, squeeze it firmly in the middle while you close the cap or plug the container. °7. Write down the amount of drainage that you have in each 24-hour period. If you have less than 2 Tbsp (30 mL) of drainage during 24 hours, contact your health care  provider. °8. Flush the drainage down the toilet. °9. Wash your hands with soap and water. °Contact a health care provider if: °· You have more redness, swelling, or pain around your drain area. °· The amount of drainage that you have is increasing instead of decreasing. °· You have pus or a bad smell coming from your drain area. °· You have a fever. °· You have drainage that is cloudy. °· There is a sudden stop or a sudden decrease in the amount of drainage that you have. °· Your tube falls out. °· Your active drain does not stay compressed after you empty it. °This information is not intended to replace advice given to you by your health care provider. Make sure you discuss any questions you have with your health care provider. °Document Released: 01/10/2000 Document Revised: 06/20/2015 Document Reviewed: 08/01/2014 °[employer]vier Interactive Patient Education © 2018 [employer] ° °

## 2017-01-26 LAB — CBC
HEMATOCRIT: 25.9 % — AB (ref 36.0–46.0)
Hemoglobin: 8.8 g/dL — ABNORMAL LOW (ref 12.0–15.0)
MCH: 28.8 pg (ref 26.0–34.0)
MCHC: 34 g/dL (ref 30.0–36.0)
MCV: 84.6 fL (ref 78.0–100.0)
PLATELETS: 587 10*3/uL — AB (ref 150–400)
RBC: 3.06 MIL/uL — ABNORMAL LOW (ref 3.87–5.11)
RDW: 14.4 % (ref 11.5–15.5)
WBC: 16.2 10*3/uL — ABNORMAL HIGH (ref 4.0–10.5)

## 2017-01-26 LAB — COMPREHENSIVE METABOLIC PANEL
ALBUMIN: 1.8 g/dL — AB (ref 3.5–5.0)
ALK PHOS: 114 U/L (ref 38–126)
ALT: 66 U/L — ABNORMAL HIGH (ref 14–54)
AST: 52 U/L — AB (ref 15–41)
Anion gap: 7 (ref 5–15)
BUN: 12 mg/dL (ref 6–20)
CALCIUM: 7.7 mg/dL — AB (ref 8.9–10.3)
CO2: 23 mmol/L (ref 22–32)
Chloride: 106 mmol/L (ref 101–111)
Creatinine, Ser: 0.58 mg/dL (ref 0.44–1.00)
GFR calc Af Amer: >60 mL/min (ref >60)
GFR calc non Af Amer: >60 mL/min (ref >60)
GLUCOSE: 80 mg/dL (ref 65–99)
Potassium: 3 mmol/L — ABNORMAL LOW (ref 3.5–5.1)
Sodium: 136 mmol/L (ref 135–145)
Total Bilirubin: 1.4 mg/dL — ABNORMAL HIGH (ref 0.3–1.2)
Total Protein: 5.9 g/dL — ABNORMAL LOW (ref 6.5–8.1)

## 2017-01-26 LAB — CEA: CEA1: 1.5 ng/mL (ref 0.0–4.7)

## 2017-01-26 MED ORDER — BISACODYL 10 MG RE SUPP
10.0000 mg | Freq: Every day | RECTAL | Status: DC | PRN
  Administered 2017-01-28: 10 mg via RECTAL
  Filled 2017-01-26: qty 1

## 2017-01-26 MED ORDER — SENNA 8.6 MG PO TABS
1.0000 | ORAL_TABLET | Freq: Every day | ORAL | Status: DC
  Administered 2017-01-26 – 2017-01-30 (×4): 8.6 mg via ORAL
  Filled 2017-01-26 (×4): qty 1

## 2017-01-26 MED ORDER — POTASSIUM CHLORIDE CRYS ER 20 MEQ PO TBCR
30.0000 meq | EXTENDED_RELEASE_TABLET | Freq: Two times a day (BID) | ORAL | Status: AC
  Administered 2017-01-26 (×2): 30 meq via ORAL
  Filled 2017-01-26 (×2): qty 1

## 2017-01-26 MED ORDER — FENTANYL CITRATE (PF) 100 MCG/2ML IJ SOLN
50.0000 ug | INTRAMUSCULAR | Status: DC | PRN
  Administered 2017-01-26 – 2017-01-27 (×2): 50 ug via INTRAVENOUS
  Filled 2017-01-26 (×2): qty 2

## 2017-01-26 MED ORDER — POLYETHYLENE GLYCOL 3350 17 G PO PACK
17.0000 g | PACK | Freq: Every day | ORAL | Status: DC
  Administered 2017-01-26 – 2017-01-30 (×3): 17 g via ORAL
  Filled 2017-01-26 (×3): qty 1

## 2017-01-26 NOTE — Progress Notes (Addendum)
PROGRESS NOTE  Molly Baker  PYP:[id number] DOB: July 23, 1970 DOA: 01/23/2017 PCP: Hulan Fess, MD   Brief Narrative: Molly Baker is a 47 y.o. female with a history of remote cholecystectomy complicated by CBD injury requiring reconstruction and obesity who presented to the ED with 2 weeks of intermittent shaking chills and abdominal pain initially attributed to chronic intermittent pain brought on by stress. The symptoms continued and worsened in severity over 2 weeks despite being on a cruise in the [place], prompting presentation to the ED when she returned. On arrival she was febrile to 102.59F, with leukocytosis 20.2k and epigastric tenderness. CT demonstrated a large complex mass in the right lobe of the liver with mild surrounding edema. Empiric antibiotics were started for presumed abscess and IR consulted for drainage, but recommended MRI for further characterization. Following MRI, Drainage was performed, culture sent, and 2 JP drains left in place 12/31.   Assessment & Plan: Principal Problem:   Hepatic abscess Active Problems:   History of common bile duct surgery  Complex hepatic abscess: s/p CT-guided drain placement. No RF's like IVDU or known immunocompromise. HIV NR. Drained fluid 12/31 was purulent consistent with abscess. - Tylenol prn fever - Switched ceftriaxone/flagyl to meropenem due to pt developing hives 12/30. Follow abscess culture drawn 12/31. Gram stain w/mod GPC in pairs in chains and rare GVR's.  - Monitor JP output - Monitor blood cultures, though these seem to have been cancelled?  - Pain control overall improving. Continue to have options for variable levels of pain, recommended toradol as needed mostly and will start bowel regimen to avoid constipation from opioids.  Hypokalemia:  - Replace and recheck  History of CBD reconstruction and hyperbilirubinemia: On MRI 12/31; Evidence of a high-grade stricture at the site of a biliary-enteric anastomosis  draining segments 5, 7 and 8 of the right liver lobe. No evidence of obstructing mass or stone. Normal caliber CBD (8 mm diameter) status post cholecystectomy. Left liver lobe and inferior right liver lobe bile ducts are normal caliber. - Fortunately, bilirubin is declining. If rising would need GI consult; otherwise outpatient GI for consideration of stenting.   Pericardiophrenic and porta hepatis adenopathy: Probably reactive.  - Recommend post treatment MRI abdomen without and with IV contrast in 3 months   Allergic reaction: 12/30. Suspected to CTX with history of anaphylaxis to PCN's. No evidence of airway compromise. - Pepcid, benadryl, solumedrol given, symptoms resolved.  - Changed abx as above  Depression: Chronic, stable - Continue bupropion  GERD: Chronic, stable - Continue PPI  DVT prophylaxis: SCDs Code Status: Full Family Communication: None at bedside today, previously updated husband at bedside Disposition Plan: Home once stable.   Consultants:   IR  Procedures:  01/25/2017: Technically successful CT guided placed of 12 Fr drainage catheter placements into the cranial-medial and inferior-lateral aspect of complex hepatic abscess yielding 200 cc of purulent, slightly blood fluid.    Aspirated sample sent to the laboratory for analysis.   Molly Bacon, MD  Antimicrobials:  Ceftriaxone/flagyl 12/30   Meropenem 12/30 >>   Subjective: Pain better controlled, no fevers. Lower BP's but no orthostatic symptoms.   Objective: Vitals:   01/25/17 1514 01/25/17 2251 01/26/17 0514 01/26/17 1340  BP: 112/67 (!) 106/53 (!) 100/57 (!) 104/46  Pulse: 83 90 85 84  Resp: 14 18 20 18   Temp: 97.8 F (36.6 C) 98.3 F (36.8 C) 98.5 F (36.9 C) 97.6 F (36.4 C)  TempSrc: Oral Oral Oral Oral  SpO2: 98%  95% 92% 99%  Weight:      Height:        Intake/Output Summary (Last 24 hours) at 01/26/2017 1401 Last data filed at 01/26/2017 1341 Gross per 24 hour  Intake 650 ml    Output 135 ml  Net 515 ml   Filed Weights   01/23/17 1922  Weight: 90.7 kg (200 lb)    Gen: 47 y.o. female in no distress  Pulm: Non-labored breathing room air. Clear to auscultation bilaterally.  CV: Regular rate and rhythm. No murmur, rub, or gallop. No JVD, no pedal edema. GI: Soft, obese tender diffusely, worst in RUQ. JP drains x2 with sanguinous discharge. + bowel sounds Ext: Warm, no deformities Skin: As above. Neuro: Alert and oriented. No focal neurological deficits. Psych: Judgement and insight appear normal. Mood & affect appropriate.   Data Reviewed: I have personally reviewed following labs and imaging studies  CBC: Recent Labs  Lab 01/23/17 2024 01/25/17 0605 01/26/17 0622  WBC 20.2* 21.6* 16.2*  NEUTROABS 16.4*  --   --   HGB 9.5* 9.5* 8.8*  HCT 27.4* 28.0* 25.9*  MCV 82.3 85.1 84.6  PLT 383 519* 443*   Basic Metabolic Panel: Recent Labs  Lab 01/23/17 2024 01/25/17 0925 01/26/17 0622  NA 132* 140 136  K 2.9* 3.3* 3.0*  CL 98* 111 106  CO2 22 23 23   GLUCOSE 86 92 80  BUN 13 15 12   CREATININE 0.65 0.63 0.58  CALCIUM 7.6* 7.9* 7.7*  MG 2.2  --   --    GFR: Estimated Creatinine Clearance: 94.8 mL/min (by C-G formula based on SCr of 0.58 mg/dL). Liver Function Tests: Recent Labs  Lab 01/23/17 2024 01/26/17 0622  AST 68* 52*  ALT 71* 66*  ALKPHOS 119 114  BILITOT 2.6* 1.4*  PROT 7.0 5.9*  ALBUMIN 2.2* 1.8*   Recent Labs  Lab 01/23/17 2024  LIPASE 46   No results for input(s): AMMONIA in the last 168 hours. Coagulation Profile: Recent Labs  Lab 01/25/17 0925  INR 1.37   Cardiac Enzymes: No results for input(s): CKTOTAL, CKMB, CKMBINDEX, TROPONINI in the last 168 hours. BNP (last 3 results) No results for input(s): PROBNP in the last 8760 hours. HbA1C: No results for input(s): HGBA1C in the last 72 hours. CBG: No results for input(s): GLUCAP in the last 168 hours. Lipid Profile: No results for input(s): CHOL, HDL, LDLCALC,  TRIG, CHOLHDL, LDLDIRECT in the last 72 hours. Thyroid Function Tests: No results for input(s): TSH, T4TOTAL, FREET4, T3FREE, THYROIDAB in the last 72 hours. Anemia Panel: No results for input(s): VITAMINB12, FOLATE, FERRITIN, TIBC, IRON, RETICCTPCT in the last 72 hours. Urine analysis:    Component Value Date/Time   COLORURINE AMBER (A) 01/23/2017 1921   APPEARANCEUR HAZY (A) 01/23/2017 1921   LABSPEC 1.010 01/23/2017 1921   PHURINE 6.0 01/23/2017 1921   GLUCOSEU NEGATIVE 01/23/2017 1921   HGBUR LARGE (A) 01/23/2017 1921   BILIRUBINUR MODERATE (A) 01/23/2017 [place] NEGATIVE 01/23/2017 1921   PROTEINUR 30 (A) 01/23/2017 1921   NITRITE NEGATIVE 01/23/2017 1921   LEUKOCYTESUR NEGATIVE 01/23/2017 1921   Recent Results (from the past 240 hour(s))  Aerobic/Anaerobic Culture (surgical/deep wound)     Status: None (Preliminary result)   Collection Time: 01/25/17  1:50 PM  Result Value Ref Range Status   Specimen Description ABSCESS LIVER  Final   Special Requests Normal  Final   Gram Stain   Final    ABUNDANT WBC PRESENT,BOTH PMN  AND MONONUCLEAR MODERATE GRAM POSITIVE COCCI IN PAIRS IN CHAINS RARE GRAM VARIABLE ROD Performed at [facility], [place] [address], [place], [place] [address]    Culture MODERATE GRAM NEGATIVE RODS  Final   Report Status PENDING  Incomplete      Radiology Studies: Mr Liver W Wo Contrast  Result Date: 01/25/2017 CLINICAL DATA:  47 year old female inpatient admitted with epigastric abdominal pain, nausea and fever with right liver dome mass on CT. Remote cholecystectomy, reportedly complicated by bile duct injury. EXAM: MRI ABDOMEN WITHOUT AND WITH CONTRAST TECHNIQUE: Multiplanar multisequence MR imaging of the abdomen was performed both before and after the administration of intravenous contrast. CONTRAST:  20 cc MultiHance IV. COMPARISON:  01/23/2017 CT abdomen/ pelvis. FINDINGS: Lower chest: Elevation of the right hemidiaphragm.  Mild-to-moderate right basilar atelectasis. A few mildly enlarged right pericardiophrenic nodes measuring up to 1.0 cm (series 1003/ image 28). Hepatobiliary: Mild hepatomegaly. Moderate to severe diffuse hepatic steatosis. There is a 12.3 x 8.9 x 10.6 cm right superior liver mass with lobular contour centered within segment 8 of the right liver lobe, with extension into liver segments 4A and 7, which demonstrates thick enhancing wall, numerous thick enhancing internal septations and prominent restricted diffusion, compatible with an hepatic abscess. A few additional scattered subcentimeter foci of T2 hyperintensity are noted in the anterior right liver lobe, which demonstrate no appreciable enhancement. Status post cholecystectomy. There is prominent intrahepatic biliary ductal dilatation isolated to segments 5, 7 and 8 in the right liver lobe, with sharp tapering of the dilated intrahepatic right liver lobe bile ducts centrally near the porta hepatis, without an appreciable obstructing mass or stone in this location. There is evidence of a biliary enteric anastomosis associated with the biliary stricture site. Scattered low signal intensity in the nondependent portion of the dilated intrahepatic right liver lobe bile ducts is compatible with pneumobilia as seen on the CT study. The left liver lobe and inferior right liver lobe intrahepatic bile ducts are normal caliber. Common bile duct diameter 8 mm. No evidence of choledocholithiasis. Pancreas: No pancreatic mass or duct dilation.  No pancreas divisum. Spleen: Normal size. No mass. Adrenals/Urinary Tract: Normal adrenals. Normal size kidneys. No hydronephrosis. There is a T2 hypointense 0.9 cm renal cortical focus in the inferior right kidney (series 7/ image 56) without enhancement on the subtraction sequences, correlating with a focus of calcification on the recent CT study, most compatible with scarring or a calcified hemorrhagic cyst. Subcentimeter simple  lower left renal cyst. No suspicious renal masses. Stomach/Bowel: Grossly normal stomach. Visualized small and large bowel is normal caliber, with no bowel wall thickening. Vascular/Lymphatic: Normal caliber abdominal aorta. Patent portal, splenic, hepatic and renal veins. Mild porta hepatis adenopathy measuring up to the 1.6 cm (series 1005/ image 61). Other: No ascites.  No additional fluid collections. Musculoskeletal: No aggressive appearing focal osseous lesions. IMPRESSION: 1. Large 12.3 cm right superior hepatic abscess, multilocular. Additional subcentimeter scattered T2 hyperintense nonenhancing right liver foci, probably additional tiny satellite abscesses or cysts. 2. Evidence of a high-grade stricture at the site of a biliary-enteric anastomosis draining segments 5, 7 and 8 of the right liver lobe. No evidence of obstructing mass or stone. 3. Normal caliber CBD (8 mm diameter) status post cholecystectomy. Left liver lobe and inferior right liver lobe bile ducts are normal caliber. 4. Nonspecific pericardiophrenic and porta hepatis adenopathy, probably reactive. Recommend post treatment MRI abdomen without and with IV contrast in 3 months . 5. Elevation of the  right hemidiaphragm with right basilar atelectasis . 6. Moderate to severe diffuse hepatic steatosis. These results were called by telephone at the time of interpretation on 01/25/2017 at 12:20 pm to Dr. Ronny Baker, who verbally acknowledged these results. Electronically Signed   By: Ilona Sorrel M.D.   On: 01/25/2017 12:22   Ct Image Guided Drainage Percut Cath  Peritoneal Retroperit  Result Date: 01/25/2017 INDICATION: Remote history of cholecystectomy complicated by a biliary injury requiring creation of a hepaticojejunostomy complicated by a recurrent biliary stricture requiring prolonged biliary catheter placement with multiple subsequent biliary angioplasties (per pt report), all of which was performed at outside institutions. Patient now  presents with right upper quadrant abdominal pain, fever and chills with cross-sectional imaging concerning for development of a large multi-locular abscess within the right lobe of the liver. Request made for percutaneous drainage catheter placement for infection source control purposes. EXAM: ULTRASOUND AND CT-GUIDED HEPATIC ABSCESS DRAINAGE CATHETER PLACEMENT X2 COMPARISON:  CT abdomen pelvis - 01/23/2017; abdominal MRI - 01/25/2017 MEDICATIONS: The patient is currently admitted to the hospital and receiving intravenous antibiotics. The antibiotics were administered within an appropriate time frame prior to the initiation of the procedure. ANESTHESIA/SEDATION: Moderate (conscious) sedation was employed during this procedure. A total of Versed 4 mg and Fentanyl 250 mcg was administered intravenously. Moderate Sedation Time: 36 minutes. The patient's level of consciousness and vital signs were monitored continuously by radiology nursing throughout the procedure under my direct supervision. CONTRAST:  None COMPLICATIONS: None immediate. PROCEDURE: Informed written consent was obtained from the patient after a discussion of the risks, benefits and alternatives to treatment. The patient was placed supine on the CT gantry and a pre procedural CT was performed re-demonstrating the known abscess/fluid collection within the right lobe of the liver with dominant ill-defined component measuring at least 10.0 x 8.6 cm (image 33, series 2). The procedure was planned. A timeout was performed prior to the initiation of the procedure. The skin overlying the right upper abdominal quadrant was prepped and draped in the usual sterile fashion. The overlying soft tissues were anesthetized with 1% lidocaine with epinephrine. Under direct ultrasound guidance, the dominant hypoechoic component within the cranial medial aspect of the multiloculated abscess was cannulated with an 18 gauge trocar needle. An Amplatz wire was coiled within  the dominant component of the abscess. Appropriate position was confirmed with CT imaging and the track was serially dilated allowing placement of a 12 French percutaneous drainage catheter. Appropriate position was confirmed with a limited CT scan. Next, the dominant hypoechoic component within in the inferolateral aspect of the multiloculated abscess was cannulated with an 18 gauge trocar needle. An Amplatz wire was coiled within this component of the abscess. Appropriate position was confirmed with CT imaging and the track was serially dilated allowing placement of a 12 French percutaneous drainage catheter. Appropriate positioning was confirmed with a limited CT scan. Ultimately, approximately 200 cc of purulent, slightly blood tinged fluid was aspirated from the abscess. Both hepatic abscess drainage catheters were connected to JP bulb is and sutured in place. Dressings were placed. The patient tolerated the above procedures well without immediate postprocedural complication. IMPRESSION: Successful ultrasound and CT-guided placement of two 12 French percutaneous hepatic abscess drainage catheters. One of the hepatic abscess drainage catheters is coiled within the dominant component within the cranial-medial aspect of the multiloculated hepatic abscess while the additional drainage catheter is coiled within the dominant component of the inferolateral aspect of the hepatic abscess. Samples were sent to  the laboratory as requested by the ordering clinical team. Electronically Signed   By: Sandi Mariscal M.D.   On: 01/25/2017 13:37   Ct Image Guided Drainage Percut Cath  Peritoneal Retroperit  Result Date: 01/25/2017 INDICATION: Remote history of cholecystectomy complicated by a biliary injury requiring creation of a hepaticojejunostomy complicated by a recurrent biliary stricture requiring prolonged biliary catheter placement with multiple subsequent biliary angioplasties (per pt report), all of which was  performed at outside institutions. Patient now presents with right upper quadrant abdominal pain, fever and chills with cross-sectional imaging concerning for development of a large multi-locular abscess within the right lobe of the liver. Request made for percutaneous drainage catheter placement for infection source control purposes. EXAM: ULTRASOUND AND CT-GUIDED HEPATIC ABSCESS DRAINAGE CATHETER PLACEMENT X2 COMPARISON:  CT abdomen pelvis - 01/23/2017; abdominal MRI - 01/25/2017 MEDICATIONS: The patient is currently admitted to the hospital and receiving intravenous antibiotics. The antibiotics were administered within an appropriate time frame prior to the initiation of the procedure. ANESTHESIA/SEDATION: Moderate (conscious) sedation was employed during this procedure. A total of Versed 4 mg and Fentanyl 250 mcg was administered intravenously. Moderate Sedation Time: 36 minutes. The patient's level of consciousness and vital signs were monitored continuously by radiology nursing throughout the procedure under my direct supervision. CONTRAST:  None COMPLICATIONS: None immediate. PROCEDURE: Informed written consent was obtained from the patient after a discussion of the risks, benefits and alternatives to treatment. The patient was placed supine on the CT gantry and a pre procedural CT was performed re-demonstrating the known abscess/fluid collection within the right lobe of the liver with dominant ill-defined component measuring at least 10.0 x 8.6 cm (image 33, series 2). The procedure was planned. A timeout was performed prior to the initiation of the procedure. The skin overlying the right upper abdominal quadrant was prepped and draped in the usual sterile fashion. The overlying soft tissues were anesthetized with 1% lidocaine with epinephrine. Under direct ultrasound guidance, the dominant hypoechoic component within the cranial medial aspect of the multiloculated abscess was cannulated with an 18 gauge  trocar needle. An Amplatz wire was coiled within the dominant component of the abscess. Appropriate position was confirmed with CT imaging and the track was serially dilated allowing placement of a 12 French percutaneous drainage catheter. Appropriate position was confirmed with a limited CT scan. Next, the dominant hypoechoic component within in the inferolateral aspect of the multiloculated abscess was cannulated with an 18 gauge trocar needle. An Amplatz wire was coiled within this component of the abscess. Appropriate position was confirmed with CT imaging and the track was serially dilated allowing placement of a 12 French percutaneous drainage catheter. Appropriate positioning was confirmed with a limited CT scan. Ultimately, approximately 200 cc of purulent, slightly blood tinged fluid was aspirated from the abscess. Both hepatic abscess drainage catheters were connected to JP bulb is and sutured in place. Dressings were placed. The patient tolerated the above procedures well without immediate postprocedural complication. IMPRESSION: Successful ultrasound and CT-guided placement of two 12 French percutaneous hepatic abscess drainage catheters. One of the hepatic abscess drainage catheters is coiled within the dominant component within the cranial-medial aspect of the multiloculated hepatic abscess while the additional drainage catheter is coiled within the dominant component of the inferolateral aspect of the hepatic abscess. Samples were sent to the laboratory as requested by the ordering clinical team. Electronically Signed   By: Sandi Mariscal M.D.   On: 01/25/2017 13:37    Scheduled Meds: . buPROPion  300 mg Oral QPC breakfast  . pantoprazole  80 mg Oral QHS  . polyethylene glycol  17 g Oral Daily  . potassium chloride  30 mEq Oral BID  . senna  1 tablet Oral Daily  . sodium chloride flush  5 mL Intravenous Q8H   Continuous Infusions: . meropenem (MERREM) IV Stopped (01/26/17 1116)     LOS: 3  days   Time spent: 35 minutes.  Molly Gather, MD [facility] Pager [phone]  If 7PM-7AM, please contact night-coverage [email or web address] Password [email or web address] 01/26/2017, 2:01 PM

## 2017-01-27 DIAGNOSIS — K831 Obstruction of bile duct: Secondary | ICD-10-CM | POA: Diagnosis present

## 2017-01-27 DIAGNOSIS — K9189 Other postprocedural complications and disorders of digestive system: Secondary | ICD-10-CM | POA: Diagnosis present

## 2017-01-27 DIAGNOSIS — Z885 Allergy status to narcotic agent status: Secondary | ICD-10-CM

## 2017-01-27 DIAGNOSIS — Z880 Allergy status to penicillin: Secondary | ICD-10-CM

## 2017-01-27 LAB — COMPREHENSIVE METABOLIC PANEL
ALBUMIN: 1.8 g/dL — AB (ref 3.5–5.0)
ALK PHOS: 117 U/L (ref 38–126)
ALT: 57 U/L — ABNORMAL HIGH (ref 14–54)
ANION GAP: 8 (ref 5–15)
AST: 43 U/L — ABNORMAL HIGH (ref 15–41)
BUN: 8 mg/dL (ref 6–20)
CALCIUM: 7.7 mg/dL — AB (ref 8.9–10.3)
CO2: 24 mmol/L (ref 22–32)
Chloride: 106 mmol/L (ref 101–111)
Creatinine, Ser: 0.52 mg/dL (ref 0.44–1.00)
GFR calc non Af Amer: >60 mL/min (ref >60)
GLUCOSE: 80 mg/dL (ref 65–99)
POTASSIUM: 3.5 mmol/L (ref 3.5–5.1)
Sodium: 138 mmol/L (ref 135–145)
Total Bilirubin: 1.4 mg/dL — ABNORMAL HIGH (ref 0.3–1.2)
Total Protein: 5.9 g/dL — ABNORMAL LOW (ref 6.5–8.1)

## 2017-01-27 LAB — CBC
HCT: 26.6 % — ABNORMAL LOW (ref 36.0–46.0)
HEMOGLOBIN: 9.1 g/dL — AB (ref 12.0–15.0)
MCH: 29.2 pg (ref 26.0–34.0)
MCHC: 34.2 g/dL (ref 30.0–36.0)
MCV: 85.3 fL (ref 78.0–100.0)
PLATELETS: 644 10*3/uL — AB (ref 150–400)
RBC: 3.12 MIL/uL — AB (ref 3.87–5.11)
RDW: 14.6 % (ref 11.5–15.5)
WBC: 12.3 10*3/uL — ABNORMAL HIGH (ref 4.0–10.5)

## 2017-01-27 NOTE — Consult Note (Signed)
[facility] CONSULT Reason for consult: Hepatic abscess with possible biliary stricture Referring Physician: [facility] hospitalist.  PCP: Dr. Hulan Fess.  Primary GI: None  Molly Baker is an 47 y.o. female.  HPI: Patient was admitted following a cruise to the [place].  She did not feel well on the cruise and after returning came to the emergency room where she was found to be febrile.  She had some cramping abdominal discomfort and nausea while on the cruise and some increased temperature.  In the emergency room a CT scan showed a complex mass with cystic and necrotic areas in the right lobe of the liver that measured 10-11 cm in diameter.  This was drained percutaneously and she still has a percutaneous drain in place.  E. coli has grown from the cultures from this abscess.  She is now on meropenem and feels much better.  Her W BC and temperature have trended down.  Her LFTs were elevated with a total bilirubin of 2.6 that is now come down to 1.4 with slight elevation of transaminases but normal alkaline phosphatase.  WBC 20.2 has come down down to 12.3 with antibiotic therapy.  Patient notes that she feels much better.Imaging studies including MRI showed in addition to the large hepatic abscess there was very prominent intrahepatic biliary ductal dilatation in the right lobe of the liver with tapering off near the porta hepatis without mass or stone clearly seen.  This was near what appeared to be an anastomotic enteric biliary anastomosis with possible stricture.  Pneumobilia was present.  Patient is a very good historian and notes that she had cholecystectomy in [place] approximately 20 years ago.  There was an injury to her bile duct at the time of surgery and after approximately 6 weeks in the hospital in [place] she was transferred to [facility].  She then underwent a bile duct reconstruction that she describes as using her small intestines to reconstruct her bile duct due to  this injury.  She had multiple leakages of bile intra-abdominal abscesses.  Following this procedure she apparently developed stricturing of the bile duct and anastomosis.  Apparently there was an initial stent placed endoscopically that was subsequently removed.  She had a percutaneous access and underwent multiple procedures for about a years that she describes as percutaneous dilatations of her bile duct that initially occurred every week and then approximately every 2 weeks.  After approximately a year the percutaneous access was removed and she is done well since that time.  Unfortunately we have no records of any of this.  History reviewed. No pertinent past medical history.  Past Surgical History:  Procedure Laterality Date  . ABDOMINAL HYSTERECTOMY     partial  . CHOLECYSTECTOMY    . common bile duct reconstruction      Family History  Problem Relation Age of Onset  . Breast cancer Maternal Grandmother     Social History:  reports that  has never smoked. she has never used smokeless tobacco. She reports that she drinks alcohol. She reports that she does not use drugs.  Allergies:  Allergies  Allergen Reactions  . Ciprofloxacin Anaphylaxis  . Codeine Anaphylaxis  . Dilaudid [Hydromorphone Hcl] Anaphylaxis  . Morphine And Related Anaphylaxis  . Penicillins Anaphylaxis    Has patient had a PCN reaction causing immediate rash, facial/tongue/throat swelling, SOB or lightheadedness with hypotension: yes Has patient had a PCN reaction causing severe rash involving mucus membranes or skin necrosis: no Has patient had a PCN  reaction that required hospitalization: yes Has patient had a PCN reaction occurring within the last 10 years: no If all of the above answers are "NO", then may proceed with Cephalosporin use.   Marland Kitchen Percocet [Oxycodone-Acetaminophen] Anaphylaxis  . Phenergan [Promethazine Hcl] Anaphylaxis  . Zosyn [Piperacillin-Tazobactam In Dex] Anaphylaxis  . Flagyl  [Metronidazole] Other (See Comments)    Flushing  . Rocephin [Ceftriaxone Sodium In Dextrose] Other (See Comments)    Flushing    Medications; Prior to Admission medications   Medication Sig Start Date End Date Taking? Authorizing Provider  buPROPion (WELLBUTRIN XL) 300 MG 24 hr tablet Take 300 mg by mouth daily after breakfast.    Yes [provider]  diphenhydrAMINE (BENADRYL) 25 MG tablet Take 25 mg by mouth every 6 (six) hours as needed for allergies.    Yes [provider]  esomeprazole (NEXIUM) 20 MG capsule Take 40 mg by mouth at bedtime.   Yes [provider]  ibuprofen (ADVIL,MOTRIN) 200 MG tablet Take 200 mg by mouth every 6 (six) hours as needed for headache, mild pain or moderate pain.    Yes [provider]   . buPROPion  300 mg Oral QPC breakfast  . pantoprazole  80 mg Oral QHS  . polyethylene glycol  17 g Oral Daily  . senna  1 tablet Oral Daily  . sodium chloride flush  5 mL Intravenous Q8H   PRN Meds acetaminophen **OR** acetaminophen, bisacodyl, diphenhydrAMINE, fentaNYL (SUBLIMAZE) injection, fentaNYL (SUBLIMAZE) injection, ondansetron **OR** ondansetron (ZOFRAN) IV Results for orders placed or performed during the hospital encounter of 01/23/17 (from the past 48 hour(s))  CBC     Status: Abnormal   Collection Time: 01/26/17  6:22 AM  Result Value Ref Range   WBC 16.2 (H) 4.0 - 10.5 K/uL   RBC 3.06 (L) 3.87 - 5.11 MIL/uL   Hemoglobin 8.8 (L) 12.0 - 15.0 g/dL   HCT 25.9 (L) 36.0 - 46.0 %   MCV 84.6 78.0 - 100.0 fL   MCH 28.8 26.0 - 34.0 pg   MCHC 34.0 30.0 - 36.0 g/dL   RDW 14.4 11.5 - 15.5 %   Platelets 587 (H) 150 - 400 K/uL  Comprehensive metabolic panel     Status: Abnormal   Collection Time: 01/26/17  6:22 AM  Result Value Ref Range   Sodium 136 135 - 145 mmol/L   Potassium 3.0 (L) 3.5 - 5.1 mmol/L   Chloride 106 101 - 111 mmol/L   CO2 23 22 - 32 mmol/L   Glucose, Bld 80 65 - 99 mg/dL   BUN 12 6 - 20 mg/dL    Creatinine, Ser 0.58 0.44 - 1.00 mg/dL   Calcium 7.7 (L) 8.9 - 10.3 mg/dL   Total Protein 5.9 (L) 6.5 - 8.1 g/dL   Albumin 1.8 (L) 3.5 - 5.0 g/dL   AST 52 (H) 15 - 41 U/L   ALT 66 (H) 14 - 54 U/L   Alkaline Phosphatase 114 38 - 126 U/L   Total Bilirubin 1.4 (H) 0.3 - 1.2 mg/dL   GFR calc non Af Amer >60 >60 mL/min   GFR calc Af Amer >60 >60 mL/min    Comment: (NOTE) The eGFR has been calculated using the CKD EPI equation. This calculation has not been validated in all clinical situations. eGFR's persistently <60 mL/min signify possible Chronic Kidney Disease.    Anion gap 7 5 - 15  CBC     Status: Abnormal   Collection Time: 01/27/17  5:51 AM  Result Value Ref Range   WBC 12.3 (H) 4.0 - 10.5 K/uL   RBC 3.12 (L) 3.87 - 5.11 MIL/uL   Hemoglobin 9.1 (L) 12.0 - 15.0 g/dL   HCT 26.6 (L) 36.0 - 46.0 %   MCV 85.3 78.0 - 100.0 fL   MCH 29.2 26.0 - 34.0 pg   MCHC 34.2 30.0 - 36.0 g/dL   RDW 14.6 11.5 - 15.5 %   Platelets 644 (H) 150 - 400 K/uL  Comprehensive metabolic panel     Status: Abnormal   Collection Time: 01/27/17  5:51 AM  Result Value Ref Range   Sodium 138 135 - 145 mmol/L   Potassium 3.5 3.5 - 5.1 mmol/L   Chloride 106 101 - 111 mmol/L   CO2 24 22 - 32 mmol/L   Glucose, Bld 80 65 - 99 mg/dL   BUN 8 6 - 20 mg/dL   Creatinine, Ser 0.52 0.44 - 1.00 mg/dL   Calcium 7.7 (L) 8.9 - 10.3 mg/dL   Total Protein 5.9 (L) 6.5 - 8.1 g/dL   Albumin 1.8 (L) 3.5 - 5.0 g/dL   AST 43 (H) 15 - 41 U/L   ALT 57 (H) 14 - 54 U/L   Alkaline Phosphatase 117 38 - 126 U/L   Total Bilirubin 1.4 (H) 0.3 - 1.2 mg/dL   GFR calc non Af Amer >60 >60 mL/min   GFR calc Af Amer >60 >60 mL/min    Comment: (NOTE) The eGFR has been calculated using the CKD EPI equation. This calculation has not been validated in all clinical situations. eGFR's persistently <60 mL/min signify possible Chronic Kidney Disease.    Anion gap 8 5 - 15    No results found. ROS: Constitutional: Until recently she has  felt well HEENT: Negative Cardiovascular: Negative Respiratory: No history of asthma or pulmonary infections GI: As above GU: Negative Musculoskeletal: Negative Neuro/Psychiatric: Negative Endocrine/Heme: Negative            Blood pressure 101/78, pulse 97, temperature 98.1 F (36.7 C), temperature source Oral, resp. rate 20, height 5' 4"  (1.626 m), weight 90.7 kg (200 lb), last menstrual period 04/03/2015, SpO2 98 %.  Physical exam:   General--Pleasant slightly obese white female in no distress ENT--nonicteric Neck--no lymphadenopathy Heart--regular rate and rhythm without murmurs or gallops Lungs--clear Abdomen--nondistended with good bowel sounds and generally soft Psych--mood and affect normal answers questions appropriately   Assessment: 1.  Large hepatic abscess.  This is been drained percutaneously.  Cultures are not final but E. coli growing sensitive to multiple antibiotics.  There apparently are other organisms isolated. 2.  Isolated dilated intrahepatic bile duct.  Fortunately total bilirubin is coming down and nearly normal.  Patient has had extensive surgery on her bile duct with reconstruction as noted above.  The exact surgical procedure is unknown at this time.  She did require percutaneous dilatation at [facility] for about a year.  Plan: 1.  Would continue antibiotics as noted above. 2.  We will send consent form to [facility] to try to obtain operative note discharge summary from her previous surgery. 3.  She will likely need repeat imaging of her biliary system once the hepatic abscess has been adequately drained and treated. We will follow with you.   Nancy Fetter 01/27/2017, 3:57 PM   This note was created using voice recognition software and minor errors may Have occurred unintentionally. Pager: [phone] If no answer or after hours call [phone]

## 2017-01-27 NOTE — Consult Note (Signed)
[name]  Total days of antibiotics 5        Day 4 meropenem               Reason for Consult: hepatic abscess, with multiple abx allergies    Referring Physician: Bonner Puna  Principal Problem:   Hepatic abscess Active Problems:   History of common bile duct surgery   Biliary anastomotic stenosis    HPI: Molly Baker is a 47 y.o. female with a history of remote cholecystectomy complicated by CBD injury requiring reconstruction/biliary stent at [facility] roughly 20 years ago. She has chronic abdominal pain with stress and noticed having some  temperature disregulation roughly 2-3 wks, She then started to have rigors, diaphoresis in the last few nights prior to admit. She has recently returned from a [place] where she was gone for 6 days and returned on day of admit. In the ED, she was found to be febrile to 102.62F, with leukocytosis 20.2k and epigastric tenderness. CT demonstrated a large complex mass measuring 12.3 x 9 x 10.6 cm with cystic or necrotic areas in the right love of the liver with surrounding edema. MRi confirmed size of large liver mass but also noted subcentimeter foci along anterior right liver lobe. There is prominent intrahepatic biliary ductal dilatation, high-grade stricutre at the site of biliary-eneteric anastomosis draining segments 5,7,8 of right liver lobe - for which GI has been consulted. She underwent drain placement by IR who initially drained 234m purulent fluid. Gram stain showed GPC in pairs/chains as well as rare GVR. There is pan-sensitive moderate ecoli isolated but other pathogen (GPC) is still pending. She was started on ceftriaxone plus metronidazole but had a rash, thus changed to meropenem. Her wbc has trended down to 12.3 today. PLT elevated at 633. Remained afebrile after 1st hospital day. She states her fever/chills/rigors have dissipated. Only slight abdominal discomfort near drains. She has not had to take  any abtx in several years. Last took azithro without difficulty. Otherwise, healthy.   Allergies:  Allergies  Allergen Reactions  . Ciprofloxacin Anaphylaxis  . Codeine Anaphylaxis  . Dilaudid [Hydromorphone Hcl] Anaphylaxis  . Morphine And Related Anaphylaxis  . Penicillins Anaphylaxis    Has patient had a PCN reaction causing immediate rash, facial/tongue/throat swelling, SOB or lightheadedness with hypotension: yes Has patient had a PCN reaction causing severe rash involving mucus membranes or skin necrosis: no Has patient had a PCN reaction that required hospitalization: yes Has patient had a PCN reaction occurring within the last 10 years: no If all of the above answers are "NO", then may proceed with Cephalosporin use.   .Marland KitchenPercocet [Oxycodone-Acetaminophen] Anaphylaxis  . Phenergan [Promethazine Hcl] Anaphylaxis  . Zosyn [Piperacillin-Tazobactam In Dex] Anaphylaxis  . Flagyl [Metronidazole] Other (See Comments)    Flushing  . Rocephin [Ceftriaxone Sodium In Dextrose] Other (See Comments)    Flushing    MEDICATIONS: . buPROPion  300 mg Oral QPC breakfast  . pantoprazole  80 mg Oral QHS  . polyethylene glycol  17 g Oral Daily  . senna  1 tablet Oral Daily  . sodium chloride flush  5 mL Intravenous Q8H    Social History   Tobacco Use  . Smoking status: Never Smoker  . Smokeless tobacco: Never Used  Substance Use Topics  . Alcohol use: Yes    Comment: occassionally  . Drug use: No    Family History  Problem Relation Age of Onset  . Breast  cancer Maternal Grandmother      Review of Systems  Constitutional: positive for fever, chills, diaphoresis, activity change, appetite change, fatigue and unexpected weight change.  HENT: Negative for congestion, sore throat, rhinorrhea, sneezing, trouble swallowing and sinus pressure.  Eyes: Negative for photophobia and visual disturbance.  Respiratory: Negative for cough, chest tightness, shortness of breath, wheezing and  stridor.  Cardiovascular: Negative for chest pain, palpitations and leg swelling.  Gastrointestinal: positive for abd pain. Negative for nausea, vomiting, abdominal pain, diarrhea, constipation, blood in stool, abdominal distention and anal bleeding.  Genitourinary: Negative for dysuria, hematuria, flank pain and difficulty urinating.  Musculoskeletal: Negative for myalgias, back pain, joint swelling, arthralgias and gait problem.  Skin: Negative for color change, pallor, rash and wound.  Neurological: Negative for dizziness, tremors, weakness and light-headedness.  Hematological: Negative for adenopathy. Does not bruise/bleed easily.  Psychiatric/Behavioral: Negative for behavioral problems, confusion, sleep disturbance, dysphoric mood, decreased concentration and agitation.      OBJECTIVE: Temp:  [98.2 F (36.8 C)-98.9 F (37.2 C)] 98.9 F (37.2 C) (01/02 0514) Pulse Rate:  [94-95] 94 (01/02 0514) Resp:  [18-19] 18 (01/02 0514) BP: (111-118)/(51-69) 118/69 (01/02 0514) SpO2:  [95 %-100 %] 95 % (01/02 0514) Physical Exam  Constitutional:  oriented to person, place, and time. appears well-developed and well-nourished. No distress.  HENT: [place]/AT, PERRLA, no scleral icterus Mouth/Throat: Oropharynx is clear and moist. No oropharyngeal exudate.  Cardiovascular: Normal rate, regular rhythm and normal heart sounds. Exam reveals no gallop and no friction rub.  No murmur heard.  Pulmonary/Chest: Effort normal and breath sounds normal. No respiratory distress.  has no wheezes.  Neck = supple, no nuchal rigidity Abdominal: Soft. Bowel sounds are normal.  Mild distension. There is no tenderness. Right hepatic drain x 2 in place- serosanginous fluid in bulb Lymphadenopathy: no cervical adenopathy. No axillary adenopathy Neurological: alert and oriented to person, place, and time.  Skin: Skin is warm and dry. No rash noted. No erythema.  Psychiatric: tearful  LABS: Results for orders placed  or performed during the hospital encounter of 01/23/17 (from the past 48 hour(s))  CBC     Status: Abnormal   Collection Time: 01/26/17  6:22 AM  Result Value Ref Range   WBC 16.2 (H) 4.0 - 10.5 K/uL   RBC 3.06 (L) 3.87 - 5.11 MIL/uL   Hemoglobin 8.8 (L) 12.0 - 15.0 g/dL   HCT 25.9 (L) 36.0 - 46.0 %   MCV 84.6 78.0 - 100.0 fL   MCH 28.8 26.0 - 34.0 pg   MCHC 34.0 30.0 - 36.0 g/dL   RDW 14.4 11.5 - 15.5 %   Platelets 587 (H) 150 - 400 K/uL  Comprehensive metabolic panel     Status: Abnormal   Collection Time: 01/26/17  6:22 AM  Result Value Ref Range   Sodium 136 135 - 145 mmol/L   Potassium 3.0 (L) 3.5 - 5.1 mmol/L   Chloride 106 101 - 111 mmol/L   CO2 23 22 - 32 mmol/L   Glucose, Bld 80 65 - 99 mg/dL   BUN 12 6 - 20 mg/dL   Creatinine, Ser 0.58 0.44 - 1.00 mg/dL   Calcium 7.7 (L) 8.9 - 10.3 mg/dL   Total Protein 5.9 (L) 6.5 - 8.1 g/dL   Albumin 1.8 (L) 3.5 - 5.0 g/dL   AST 52 (H) 15 - 41 U/L   ALT 66 (H) 14 - 54 U/L   Alkaline Phosphatase 114 38 - 126 U/L   Total  Bilirubin 1.4 (H) 0.3 - 1.2 mg/dL   GFR calc non Af Amer >60 >60 mL/min   GFR calc Af Amer >60 >60 mL/min    Comment: (NOTE) The eGFR has been calculated using the CKD EPI equation. This calculation has not been validated in all clinical situations. eGFR's persistently <60 mL/min signify possible Chronic Kidney Disease.    Anion gap 7 5 - 15  CBC     Status: Abnormal   Collection Time: 01/27/17  5:51 AM  Result Value Ref Range   WBC 12.3 (H) 4.0 - 10.5 K/uL   RBC 3.12 (L) 3.87 - 5.11 MIL/uL   Hemoglobin 9.1 (L) 12.0 - 15.0 g/dL   HCT 26.6 (L) 36.0 - 46.0 %   MCV 85.3 78.0 - 100.0 fL   MCH 29.2 26.0 - 34.0 pg   MCHC 34.2 30.0 - 36.0 g/dL   RDW 14.6 11.5 - 15.5 %   Platelets 644 (H) 150 - 400 K/uL  Comprehensive metabolic panel     Status: Abnormal   Collection Time: 01/27/17  5:51 AM  Result Value Ref Range   Sodium 138 135 - 145 mmol/L   Potassium 3.5 3.5 - 5.1 mmol/L   Chloride 106 101 - 111 mmol/L     CO2 24 22 - 32 mmol/L   Glucose, Bld 80 65 - 99 mg/dL   BUN 8 6 - 20 mg/dL   Creatinine, Ser 0.52 0.44 - 1.00 mg/dL   Calcium 7.7 (L) 8.9 - 10.3 mg/dL   Total Protein 5.9 (L) 6.5 - 8.1 g/dL   Albumin 1.8 (L) 3.5 - 5.0 g/dL   AST 43 (H) 15 - 41 U/L   ALT 57 (H) 14 - 54 U/L   Alkaline Phosphatase 117 38 - 126 U/L   Total Bilirubin 1.4 (H) 0.3 - 1.2 mg/dL   GFR calc non Af Amer >60 >60 mL/min   GFR calc Af Amer >60 >60 mL/min    Comment: (NOTE) The eGFR has been calculated using the CKD EPI equation. This calculation has not been validated in all clinical situations. eGFR's persistently <60 mL/min signify possible Chronic Kidney Disease.    Anion gap 8 5 - 15    MICRO: 12/31 abscess fluid - Ecoli IMAGING: No results found.  Assessment/Plan:  47yo F with hx of  Remote complicated cholecystectomy with CBD injury with biliary stent, now found to have large hepatic abscess as well as intrahepatic biliary ductal dilatation  - recommend to continue with meropenem for now. Likely polymicrobial. Await to see what GPC is identified. If strep species, it is ok to continue on current regimen. - I think we have limited oral abtx options given her drug allergies. Possibly could do bactrim (ecoli) plus metronidazole, but bactrim has poor strep coverage. I would favor a few weeks of IV abtx to ensure proper coverage on abtx that she tolerates - will recommend 2-3 wk IV therapy til drain is removed and depending on what repeat imaging shows. Will need picc line  Intra-hepatic biliary ductal dilatation = - question to GI if high-grade stricture at the site of biliary-eneteric anastomosis seen on MRI is the cause of her hepatic abscess, and if she would be a ERCP candidate, requiring new stent?.  thrombocytosis = likely acute phase reactant to her hepatic infection.

## 2017-01-27 NOTE — Progress Notes (Signed)
Pharmacy Antibiotic Note  Molly Baker is a 47 y.o. female admitted on 01/23/2017 with intra-abdominal infecton.  She is s/p IR drainage of hepatic abscess and drain placement on 12/31.  Pharmacy has been consulted for meropenem dosing after possible allergic reaction to ceftriaxone.  Today, 01/27/2017:  Day #5 abx total, Day #4 meropenem  Afebrile  WBC elevated but improved  SCr low/stable  Plan:  Continue meropenem 1g IV q8h  Monitor clinical course, renal function, cultures as available  Height: 5\' 4"  (162.6 cm) Weight: 200 lb (90.7 kg) IBW/kg (Calculated) : 54.7  Temp (24hrs), Avg:98.2 F (36.8 C), Min:97.6 F (36.4 C), Max:98.9 F (37.2 C)  Recent Labs  Lab 01/23/17 2024 01/25/17 0605 01/25/17 0925 01/26/17 0622 01/27/17 0551  WBC 20.2* 21.6*  --  16.2* 12.3*  CREATININE 0.65  --  0.63 0.58 0.52    Estimated Creatinine Clearance: 94.8 mL/min (by C-G formula based on SCr of 0.52 mg/dL).    Allergies  Allergen Reactions  . Ciprofloxacin Anaphylaxis  . Codeine Anaphylaxis  . Dilaudid [Hydromorphone Hcl] Anaphylaxis  . Morphine And Related Anaphylaxis  . Penicillins Anaphylaxis    Has patient had a PCN reaction causing immediate rash, facial/tongue/throat swelling, SOB or lightheadedness with hypotension: yes Has patient had a PCN reaction causing severe rash involving mucus membranes or skin necrosis: no Has patient had a PCN reaction that required hospitalization: yes Has patient had a PCN reaction occurring within the last 10 years: no If all of the above answers are "NO", then may proceed with Cephalosporin use.   Marland Kitchen Percocet [Oxycodone-Acetaminophen] Anaphylaxis  . Phenergan [Promethazine Hcl] Anaphylaxis  . Zosyn [Piperacillin-Tazobactam In Dex] Anaphylaxis  . Flagyl [Metronidazole] Other (See Comments)    Flushing  . Rocephin [Ceftriaxone Sodium In Dextrose] Other (See Comments)    Flushing    Antimicrobials this admission: 12/29 metronidazole >>  12/30 12/29 ceftriaxone >> 12/30 12/30 meropenem >>  Dose adjustments this admission:  Microbiology results: 12/31 Liver abscess: moderate GNR   Thank you for allowing pharmacy to be a part of this patient's care.  Gretta Arab PharmD, BCPS Pager [phone] 01/27/2017 9:24 AM

## 2017-01-27 NOTE — Progress Notes (Addendum)
PROGRESS NOTE  Jalesa Thien  DGU:[id number] DOB: May 02, 1970 DOA: 01/23/2017 PCP: Hulan Fess, MD   Brief Narrative: Niaomi Cartaya is a 47 y.o. female with a history of remote cholecystectomy complicated by CBD injury requiring reconstruction and obesity who presented to the ED with 2 weeks of intermittent shaking chills and abdominal pain initially attributed to chronic intermittent pain brought on by stress. The symptoms continued and worsened in severity over 2 weeks despite being on a cruise in the [place], prompting presentation to the ED when she returned. On arrival she was febrile to 102.45F, with leukocytosis 20.2k and epigastric tenderness. CT demonstrated a large complex mass in the right lobe of the liver with mild surrounding edema. Empiric antibiotics were started for presumed abscess and IR consulted for drainage, but recommended MRI for further characterization. Following MRI, drainage was performed, culture sent, and 2 JP drains left in place 12/31. Cultures have grown GNR's, pending speciation, though she seems to be improving on meropenem. GI is consulted for biliary-enteric anastomotic stenosis seen on imaging.   Assessment & Plan: Principal Problem:   Hepatic abscess Active Problems:   History of common bile duct surgery  Complex hepatic abscess: s/p CT-guided drain placement. No RF's like IVDU or known immunocompromise. HIV NR. Drained fluid 12/31 was purulent consistent with abscess. - Tylenol prn fever - Switched ceftriaxone/flagyl to meropenem due to pt developing hives 12/30. I've asked for ID assistance going forward. - Follow abscess culture drawn 12/31, currently GNR's.  - Monitor JP output, defer reimaging/management to IR. - Blood cultures seem to have been cancelled.  - Pain control with fentanyl (w/bowel regimen) and tylenol which she has tolerated in the past. Having pruritic erythematous spotty rash after starting toradol so this is discontinued.     Hypokalemia:  - Replaced. Monitor.  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 No evidence of obstructing mass or stone. Normal caliber CBD (8 mm diameter) status post cholecystectomy. Left liver lobe and inferior right liver lobe bile ducts are normal caliber. - Bilirubin stable, mildly elevated. Pt has not established care in [place] w/GI. I have consulted GI for recommendations. I appreciate their assessment.   Pericardiophrenic and porta hepatis adenopathy: Probably reactive.  - Recommend post treatment MRI abdomen without and with IV contrast in 3 months   Allergic reaction: 12/30 suspected to CTX with history of anaphylaxis to PCN's. Also with mild hives 1/2 soon after starting toradol (has tolerated other new medications given at that time). No evidence of airway compromise. - Antihistamine, DC toradol - Changed abx as above  Depression: Chronic, stable - Continue bupropion  GERD: Chronic, stable - Continue PPI  DVT prophylaxis: SCDs Code Status: Full Family Communication: Mother at bedside Disposition Plan: Home once stable.   Consultants:   IR  GI  Procedures:  01/25/2017: Technically successful CT guided placed of 12 Fr drainage catheter placements into the cranial-medial and inferior-lateral aspect of complex hepatic abscess yielding 200 cc of purulent, slightly blood fluid.    Aspirated sample sent to the laboratory for analysis.   Ronny Bacon, MD  Antimicrobials:  Ceftriaxone/flagyl 12/30   Meropenem 12/30 >>   Subjective: Had episode of uncontrolled pain last night improved with toradol which has been effective, though she noted mildly itchy splotchy red rash on upper left arm and back this morning. Felt warm without documented fever this morning.   Objective: Vitals:  01/26/17 0514 01/26/17 1340 01/26/17 2045 01/27/17 0514  BP: (!) 100/57  (!) 104/46 (!) 111/51 118/69  Pulse: 85 84 95 94  Resp: 20 18 19 18   Temp: 98.5 F (36.9 C) 97.6 F (36.4 C) 98.2 F (36.8 C) 98.9 F (37.2 C)  TempSrc: Oral Oral Oral Oral  SpO2: 92% 99% 100% 95%  Weight:      Height:        Intake/Output Summary (Last 24 hours) at 01/27/2017 1211 Last data filed at 01/27/2017 0831 Gross per 24 hour  Intake 550 ml  Output 110 ml  Net 440 ml   Filed Weights   01/23/17 1922  Weight: 90.7 kg (200 lb)   Gen: 47 y.o. female in no distress  Pulm: Non-labored breathing room air. Clear to auscultation bilaterally. No wheezing or stridor. CV: Regular rate and rhythm. No murmur, rub, or gallop. No JVD, no pedal edema. GI: Soft, obese tender diffusely, worst in RUQ. JP drains x2 with sanguinous discharge. + bowel sounds Ext: Warm, no deformities Skin: Macular erythematous splotchy eruptions on right lower back, left upper back and left medial upper arm which are not raised.  Neuro: Alert and oriented. No focal neurological deficits. Psych: Judgement and insight appear normal. Mood & affect appropriate.   CBC: Recent Labs  Lab 01/23/17 2024 01/25/17 0605 01/26/17 0622 01/27/17 0551  WBC 20.2* 21.6* 16.2* 12.3*  NEUTROABS 16.4*  --   --   --   HGB 9.5* 9.5* 8.8* 9.1*  HCT 27.4* 28.0* 25.9* 26.6*  MCV 82.3 85.1 84.6 85.3  PLT 383 519* 587* 254*   Basic Metabolic Panel: Recent Labs  Lab 01/23/17 2024 01/25/17 0925 01/26/17 0622 01/27/17 0551  NA 132* 140 136 138  K 2.9* 3.3* 3.0* 3.5  CL 98* 111 106 106  CO2 22 23 23 24   GLUCOSE 86 92 80 80  BUN 13 15 12 8   CREATININE 0.65 0.63 0.58 0.52  CALCIUM 7.6* 7.9* 7.7* 7.7*  MG 2.2  --   --   --    GFR: Estimated Creatinine Clearance: 94.8 mL/min (by C-G formula based on SCr of 0.52 mg/dL). Liver Function Tests: Recent Labs  Lab 01/23/17 2024 01/26/17 0622 01/27/17 0551  AST 68* 52* 43*  ALT 71* 66* 57*  ALKPHOS 119 114 117  BILITOT 2.6* 1.4* 1.4*  PROT 7.0 5.9* 5.9*  ALBUMIN 2.2*  1.8* 1.8*   Recent Labs  Lab 01/23/17 2024  LIPASE 46   Coagulation Profile: Recent Labs  Lab 01/25/17 0925  INR 1.37   Urine analysis:    Component Value Date/Time   COLORURINE AMBER (A) 01/23/2017 1921   APPEARANCEUR HAZY (A) 01/23/2017 1921   LABSPEC 1.010 01/23/2017 1921   PHURINE 6.0 01/23/2017 1921   GLUCOSEU NEGATIVE 01/23/2017 1921   HGBUR LARGE (A) 01/23/2017 1921   BILIRUBINUR MODERATE (A) 01/23/2017 [place] NEGATIVE 01/23/2017 1921   PROTEINUR 30 (A) 01/23/2017 1921   NITRITE NEGATIVE 01/23/2017 1921   LEUKOCYTESUR NEGATIVE 01/23/2017 1921   Recent Results (from the past 240 hour(s))  Aerobic/Anaerobic Culture (surgical/deep wound)     Status: None (Preliminary result)   Collection Time: 01/25/17  1:50 PM  Result Value Ref Range Status   Specimen Description ABSCESS LIVER  Final   Special Requests Normal  Final   Gram Stain   Final    ABUNDANT WBC PRESENT,BOTH PMN AND MONONUCLEAR MODERATE GRAM POSITIVE COCCI IN PAIRS IN CHAINS RARE GRAM VARIABLE ROD Performed at [facility]  [facility], [place] [address], [place], [place] [address]    Culture MODERATE GRAM NEGATIVE RODS  Final   Report Status PENDING  Incomplete      Radiology Studies: Ct Image Guided Drainage Percut Cath  Peritoneal Retroperit  Result Date: 01/25/2017 INDICATION: Remote history of cholecystectomy complicated by a biliary injury requiring creation of a hepaticojejunostomy complicated by a recurrent biliary stricture requiring prolonged biliary catheter placement with multiple subsequent biliary angioplasties (per pt report), all of which was performed at outside institutions. Patient now presents with right upper quadrant abdominal pain, fever and chills with cross-sectional imaging concerning for development of a large multi-locular abscess within the right lobe of the liver. Request made for percutaneous drainage catheter placement for infection source control purposes. EXAM: ULTRASOUND  AND CT-GUIDED HEPATIC ABSCESS DRAINAGE CATHETER PLACEMENT X2 COMPARISON:  CT abdomen pelvis - 01/23/2017; abdominal MRI - 01/25/2017 MEDICATIONS: The patient is currently admitted to the hospital and receiving intravenous antibiotics. The antibiotics were administered within an appropriate time frame prior to the initiation of the procedure. ANESTHESIA/SEDATION: Moderate (conscious) sedation was employed during this procedure. A total of Versed 4 mg and Fentanyl 250 mcg was administered intravenously. Moderate Sedation Time: 36 minutes. The patient's level of consciousness and vital signs were monitored continuously by radiology nursing throughout the procedure under my direct supervision. CONTRAST:  None COMPLICATIONS: None immediate. PROCEDURE: Informed written consent was obtained from the patient after a discussion of the risks, benefits and alternatives to treatment. The patient was placed supine on the CT gantry and a pre procedural CT was performed re-demonstrating the known abscess/fluid collection within the right lobe of the liver with dominant ill-defined component measuring at least 10.0 x 8.6 cm (image 33, series 2). The procedure was planned. A timeout was performed prior to the initiation of the procedure. The skin overlying the right upper abdominal quadrant was prepped and draped in the usual sterile fashion. The overlying soft tissues were anesthetized with 1% lidocaine with epinephrine. Under direct ultrasound guidance, the dominant hypoechoic component within the cranial medial aspect of the multiloculated abscess was cannulated with an 18 gauge trocar needle. An Amplatz wire was coiled within the dominant component of the abscess. Appropriate position was confirmed with CT imaging and the track was serially dilated allowing placement of a 12 French percutaneous drainage catheter. Appropriate position was confirmed with a limited CT scan. Next, the dominant hypoechoic component within in the  inferolateral aspect of the multiloculated abscess was cannulated with an 18 gauge trocar needle. An Amplatz wire was coiled within this component of the abscess. Appropriate position was confirmed with CT imaging and the track was serially dilated allowing placement of a 12 French percutaneous drainage catheter. Appropriate positioning was confirmed with a limited CT scan. Ultimately, approximately 200 cc of purulent, slightly blood tinged fluid was aspirated from the abscess. Both hepatic abscess drainage catheters were connected to JP bulb is and sutured in place. Dressings were placed. The patient tolerated the above procedures well without immediate postprocedural complication. IMPRESSION: Successful ultrasound and CT-guided placement of two 12 French percutaneous hepatic abscess drainage catheters. One of the hepatic abscess drainage catheters is coiled within the dominant component within the cranial-medial aspect of the multiloculated hepatic abscess while the additional drainage catheter is coiled within the dominant component of the inferolateral aspect of the hepatic abscess. Samples were sent to the laboratory as requested by the ordering clinical team. Electronically Signed   By: Sandi Mariscal M.D.   On:  01/25/2017 13:37   Ct Image Guided Drainage Percut Cath  Peritoneal Retroperit  Result Date: 01/25/2017 INDICATION: Remote history of cholecystectomy complicated by a biliary injury requiring creation of a hepaticojejunostomy complicated by a recurrent biliary stricture requiring prolonged biliary catheter placement with multiple subsequent biliary angioplasties (per pt report), all of which was performed at outside institutions. Patient now presents with right upper quadrant abdominal pain, fever and chills with cross-sectional imaging concerning for development of a large multi-locular abscess within the right lobe of the liver. Request made for percutaneous drainage catheter placement for  infection source control purposes. EXAM: ULTRASOUND AND CT-GUIDED HEPATIC ABSCESS DRAINAGE CATHETER PLACEMENT X2 COMPARISON:  CT abdomen pelvis - 01/23/2017; abdominal MRI - 01/25/2017 MEDICATIONS: The patient is currently admitted to the hospital and receiving intravenous antibiotics. The antibiotics were administered within an appropriate time frame prior to the initiation of the procedure. ANESTHESIA/SEDATION: Moderate (conscious) sedation was employed during this procedure. A total of Versed 4 mg and Fentanyl 250 mcg was administered intravenously. Moderate Sedation Time: 36 minutes. The patient's level of consciousness and vital signs were monitored continuously by radiology nursing throughout the procedure under my direct supervision. CONTRAST:  None COMPLICATIONS: None immediate. PROCEDURE: Informed written consent was obtained from the patient after a discussion of the risks, benefits and alternatives to treatment. The patient was placed supine on the CT gantry and a pre procedural CT was performed re-demonstrating the known abscess/fluid collection within the right lobe of the liver with dominant ill-defined component measuring at least 10.0 x 8.6 cm (image 33, series 2). The procedure was planned. A timeout was performed prior to the initiation of the procedure. The skin overlying the right upper abdominal quadrant was prepped and draped in the usual sterile fashion. The overlying soft tissues were anesthetized with 1% lidocaine with epinephrine. Under direct ultrasound guidance, the dominant hypoechoic component within the cranial medial aspect of the multiloculated abscess was cannulated with an 18 gauge trocar needle. An Amplatz wire was coiled within the dominant component of the abscess. Appropriate position was confirmed with CT imaging and the track was serially dilated allowing placement of a 12 French percutaneous drainage catheter. Appropriate position was confirmed with a limited CT scan. Next,  the dominant hypoechoic component within in the inferolateral aspect of the multiloculated abscess was cannulated with an 18 gauge trocar needle. An Amplatz wire was coiled within this component of the abscess. Appropriate position was confirmed with CT imaging and the track was serially dilated allowing placement of a 12 French percutaneous drainage catheter. Appropriate positioning was confirmed with a limited CT scan. Ultimately, approximately 200 cc of purulent, slightly blood tinged fluid was aspirated from the abscess. Both hepatic abscess drainage catheters were connected to JP bulb is and sutured in place. Dressings were placed. The patient tolerated the above procedures well without immediate postprocedural complication. IMPRESSION: Successful ultrasound and CT-guided placement of two 12 French percutaneous hepatic abscess drainage catheters. One of the hepatic abscess drainage catheters is coiled within the dominant component within the cranial-medial aspect of the multiloculated hepatic abscess while the additional drainage catheter is coiled within the dominant component of the inferolateral aspect of the hepatic abscess. Samples were sent to the laboratory as requested by the ordering clinical team. Electronically Signed   By: Sandi Mariscal M.D.   On: 01/25/2017 13:37    Scheduled Meds: . buPROPion  300 mg Oral QPC breakfast  . pantoprazole  80 mg Oral QHS  . polyethylene glycol  17 g  Oral Daily  . senna  1 tablet Oral Daily  . sodium chloride flush  5 mL Intravenous Q8H   Continuous Infusions: . meropenem (MERREM) IV Stopped (01/27/17 1107)     LOS: 4 days   Time spent: 35 minutes.  Vance Gather, MD [facility] Pager [phone]  If 7PM-7AM, please contact night-coverage [email or web address] Password [email or web address] 01/27/2017, 12:11 PM

## 2017-01-27 NOTE — Progress Notes (Signed)
Patient ID: Molly Baker, female   DOB: 1970/06/27, 47 y.o.   MRN: [id number]    Referring Physician(s): Dr. Vance Gather  Supervising Physician: Daryll Brod  Patient Status: [facility] - In-pt  Chief Complaint: Liver abscess  Subjective: Patient is feeling much better.  Still tender around drain sites, but otherwise feeling well.  Allergies: Ciprofloxacin; Codeine; Dilaudid [hydromorphone hcl]; Morphine and related; Penicillins; Percocet [oxycodone-acetaminophen]; Phenergan [promethazine hcl]; Zosyn [piperacillin-tazobactam in dex]; Flagyl [metronidazole]; and Rocephin [ceftriaxone sodium in dextrose]  Medications: Prior to Admission medications   Medication Sig Start Date End Date Taking? Authorizing Provider  buPROPion (WELLBUTRIN XL) 300 MG 24 hr tablet Take 300 mg by mouth daily after breakfast.    Yes [provider]  diphenhydrAMINE (BENADRYL) 25 MG tablet Take 25 mg by mouth every 6 (six) hours as needed for allergies.    Yes [provider]  esomeprazole (NEXIUM) 20 MG capsule Take 40 mg by mouth at bedtime.   Yes [provider]  ibuprofen (ADVIL,MOTRIN) 200 MG tablet Take 200 mg by mouth every 6 (six) hours as needed for headache, mild pain or moderate pain.    Yes [provider]    Vital Signs: BP 101/78 (BP Location: Left Arm)   Pulse 97   Temp 98.1 F (36.7 C) (Oral)   Resp 20   Ht 5\' 4"  (1.626 m)   Wt 200 lb (90.7 kg)   LMP 04/03/2015   SpO2 98%   BMI 34.33 kg/m   Physical Exam: Abd: soft, both drains with bloody purulent output.  Drain sites are c/d/i 70cc and 40cc output yesterday.  Imaging: Ct Abdomen Pelvis W Contrast  Result Date: 01/23/2017 CLINICAL DATA:  47 year old female with epigastric pain and fever. Prior cholecystectomy. EXAM: CT ABDOMEN AND PELVIS WITH CONTRAST TECHNIQUE: Multidetector CT imaging of the abdomen and pelvis was performed using the standard protocol following bolus administration of intravenous  contrast. CONTRAST:  140mL ISOVUE-300 IOPAMIDOL (ISOVUE-300) INJECTION 61% COMPARISON:  None. FINDINGS: Lower chest: There is mild eventration of the right hemidiaphragm with right lung base atelectatic changes. The left lung base is clear. No intra-abdominal free air or free fluid. Hepatobiliary: There is diffuse fatty infiltration of the liver. The liver is enlarged measuring 18 cm in midclavicular length. There is an 11 x 11 cm complex mass with cystic or necrotic areas in the right lobe of the liver with mild surrounding edema. There is associated mass effect and displacement of the adjacent liver parenchyma and vasculature. This lesion is not well characterized but may represent a neoplasm or an infectious process/abscess. Correlation with clinical exam and further evaluation with dedicated MRI without and with contrast recommended. There is postsurgical changes of cholecystectomy. There is pneumobilia. Pancreas: Unremarkable. No pancreatic ductal dilatation or surrounding inflammatory changes. Spleen: Normal in size without focal abnormality. Adrenals/Urinary Tract: The adrenal glands are unremarkable. Amorphous calcification of the inferior pole of the right kidney measuring 10 mm in diameter. There is no hydronephrosis on either side. The visualized ureters and urinary bladder appear unremarkable. Stomach/Bowel: No bowel obstruction or active inflammation. The appendix is not visualized with certainty. No inflammatory changes identified in the right lower quadrant. Vascular/Lymphatic: No significant vascular findings are present. No enlarged abdominal or pelvic lymph nodes. Reproductive: Hysterectomy. No pelvic mass. The ovaries are unremarkable. Other: None Musculoskeletal: No acute or significant osseous findings. IMPRESSION: 1. Large complex on mass in the right lobe of the liver with mild surrounding edema. This may represent a malignancy or an  infectious process/abscess. Further evaluation with MRI  recommended. 2. Hepatomegaly with fatty infiltration of the liver. 3. Cholecystectomy with pneumobilia. 4. No bowel obstruction or active inflammation. 5. Focus of amorphous calcific density in the inferior pole of the right kidney. Electronically Signed   By: Anner Crete M.D.   On: 01/23/2017 22:28   Mr Liver W Wo Contrast  Result Date: 01/25/2017 CLINICAL DATA:  47 year old female inpatient admitted with epigastric abdominal pain, nausea and fever with right liver dome mass on CT. Remote cholecystectomy, reportedly complicated by bile duct injury. EXAM: MRI ABDOMEN WITHOUT AND WITH CONTRAST TECHNIQUE: Multiplanar multisequence MR imaging of the abdomen was performed both before and after the administration of intravenous contrast. CONTRAST:  20 cc MultiHance IV. COMPARISON:  01/23/2017 CT abdomen/ pelvis. FINDINGS: Lower chest: Elevation of the right hemidiaphragm. Mild-to-moderate right basilar atelectasis. A few mildly enlarged right pericardiophrenic nodes measuring up to 1.0 cm (series 1003/ image 28). Hepatobiliary: Mild hepatomegaly. Moderate to severe diffuse hepatic steatosis. There is a 12.3 x 8.9 x 10.6 cm right superior liver mass with lobular contour centered within segment 8 of the right liver lobe, with extension into liver segments 4A and 7, which demonstrates thick enhancing wall, numerous thick enhancing internal septations and prominent restricted diffusion, compatible with an hepatic abscess. A few additional scattered subcentimeter foci of T2 hyperintensity are noted in the anterior right liver lobe, which demonstrate no appreciable enhancement. Status post cholecystectomy. There is prominent intrahepatic biliary ductal dilatation isolated to segments 5, 7 and 8 in the right liver lobe, with sharp tapering of the dilated intrahepatic right liver lobe bile ducts centrally near the porta hepatis, without an appreciable obstructing mass or stone in this location. There is evidence of a  biliary enteric anastomosis associated with the biliary stricture site. Scattered low signal intensity in the nondependent portion of the dilated intrahepatic right liver lobe bile ducts is compatible with pneumobilia as seen on the CT study. The left liver lobe and inferior right liver lobe intrahepatic bile ducts are normal caliber. Common bile duct diameter 8 mm. No evidence of choledocholithiasis. Pancreas: No pancreatic mass or duct dilation.  No pancreas divisum. Spleen: Normal size. No mass. Adrenals/Urinary Tract: Normal adrenals. Normal size kidneys. No hydronephrosis. There is a T2 hypointense 0.9 cm renal cortical focus in the inferior right kidney (series 7/ image 56) without enhancement on the subtraction sequences, correlating with a focus of calcification on the recent CT study, most compatible with scarring or a calcified hemorrhagic cyst. Subcentimeter simple lower left renal cyst. No suspicious renal masses. Stomach/Bowel: Grossly normal stomach. Visualized small and large bowel is normal caliber, with no bowel wall thickening. Vascular/Lymphatic: Normal caliber abdominal aorta. Patent portal, splenic, hepatic and renal veins. Mild porta hepatis adenopathy measuring up to the 1.6 cm (series 1005/ image 61). Other: No ascites.  No additional fluid collections. Musculoskeletal: No aggressive appearing focal osseous lesions. IMPRESSION: 1. Large 12.3 cm right superior hepatic abscess, multilocular. Additional subcentimeter scattered T2 hyperintense nonenhancing right liver foci, probably additional tiny satellite abscesses or cysts. 2. Evidence of a high-grade stricture at the site of a biliary-enteric anastomosis draining segments 5, 7 and 8 of the right liver lobe. No evidence of obstructing mass or stone. 3. Normal caliber CBD (8 mm diameter) status post cholecystectomy. Left liver lobe and inferior right liver lobe bile ducts are normal caliber. 4. Nonspecific pericardiophrenic and porta hepatis  adenopathy, probably reactive. Recommend post treatment MRI abdomen without and with IV contrast in 3  months . 5. Elevation of the right hemidiaphragm with right basilar atelectasis . 6. Moderate to severe diffuse hepatic steatosis. These results were called by telephone at the time of interpretation on 01/25/2017 at 12:20 pm to Dr. Ronny Bacon, who verbally acknowledged these results. Electronically Signed   By: Ilona Sorrel M.D.   On: 01/25/2017 12:22   Ct Image Guided Drainage Percut Cath  Peritoneal Retroperit  Result Date: 01/25/2017 INDICATION: Remote history of cholecystectomy complicated by a biliary injury requiring creation of a hepaticojejunostomy complicated by a recurrent biliary stricture requiring prolonged biliary catheter placement with multiple subsequent biliary angioplasties (per pt report), all of which was performed at outside institutions. Patient now presents with right upper quadrant abdominal pain, fever and chills with cross-sectional imaging concerning for development of a large multi-locular abscess within the right lobe of the liver. Request made for percutaneous drainage catheter placement for infection source control purposes. EXAM: ULTRASOUND AND CT-GUIDED HEPATIC ABSCESS DRAINAGE CATHETER PLACEMENT X2 COMPARISON:  CT abdomen pelvis - 01/23/2017; abdominal MRI - 01/25/2017 MEDICATIONS: The patient is currently admitted to the hospital and receiving intravenous antibiotics. The antibiotics were administered within an appropriate time frame prior to the initiation of the procedure. ANESTHESIA/SEDATION: Moderate (conscious) sedation was employed during this procedure. A total of Versed 4 mg and Fentanyl 250 mcg was administered intravenously. Moderate Sedation Time: 36 minutes. The patient's level of consciousness and vital signs were monitored continuously by radiology nursing throughout the procedure under my direct supervision. CONTRAST:  None COMPLICATIONS: None immediate.  PROCEDURE: Informed written consent was obtained from the patient after a discussion of the risks, benefits and alternatives to treatment. The patient was placed supine on the CT gantry and a pre procedural CT was performed re-demonstrating the known abscess/fluid collection within the right lobe of the liver with dominant ill-defined component measuring at least 10.0 x 8.6 cm (image 33, series 2). The procedure was planned. A timeout was performed prior to the initiation of the procedure. The skin overlying the right upper abdominal quadrant was prepped and draped in the usual sterile fashion. The overlying soft tissues were anesthetized with 1% lidocaine with epinephrine. Under direct ultrasound guidance, the dominant hypoechoic component within the cranial medial aspect of the multiloculated abscess was cannulated with an 18 gauge trocar needle. An Amplatz wire was coiled within the dominant component of the abscess. Appropriate position was confirmed with CT imaging and the track was serially dilated allowing placement of a 12 French percutaneous drainage catheter. Appropriate position was confirmed with a limited CT scan. Next, the dominant hypoechoic component within in the inferolateral aspect of the multiloculated abscess was cannulated with an 18 gauge trocar needle. An Amplatz wire was coiled within this component of the abscess. Appropriate position was confirmed with CT imaging and the track was serially dilated allowing placement of a 12 French percutaneous drainage catheter. Appropriate positioning was confirmed with a limited CT scan. Ultimately, approximately 200 cc of purulent, slightly blood tinged fluid was aspirated from the abscess. Both hepatic abscess drainage catheters were connected to JP bulb is and sutured in place. Dressings were placed. The patient tolerated the above procedures well without immediate postprocedural complication. IMPRESSION: Successful ultrasound and CT-guided placement  of two 12 French percutaneous hepatic abscess drainage catheters. One of the hepatic abscess drainage catheters is coiled within the dominant component within the cranial-medial aspect of the multiloculated hepatic abscess while the additional drainage catheter is coiled within the dominant component of the inferolateral aspect of the  hepatic abscess. Samples were sent to the laboratory as requested by the ordering clinical team. Electronically Signed   By: Sandi Mariscal M.D.   On: 01/25/2017 13:37   Ct Image Guided Drainage Percut Cath  Peritoneal Retroperit  Result Date: 01/25/2017 INDICATION: Remote history of cholecystectomy complicated by a biliary injury requiring creation of a hepaticojejunostomy complicated by a recurrent biliary stricture requiring prolonged biliary catheter placement with multiple subsequent biliary angioplasties (per pt report), all of which was performed at outside institutions. Patient now presents with right upper quadrant abdominal pain, fever and chills with cross-sectional imaging concerning for development of a large multi-locular abscess within the right lobe of the liver. Request made for percutaneous drainage catheter placement for infection source control purposes. EXAM: ULTRASOUND AND CT-GUIDED HEPATIC ABSCESS DRAINAGE CATHETER PLACEMENT X2 COMPARISON:  CT abdomen pelvis - 01/23/2017; abdominal MRI - 01/25/2017 MEDICATIONS: The patient is currently admitted to the hospital and receiving intravenous antibiotics. The antibiotics were administered within an appropriate time frame prior to the initiation of the procedure. ANESTHESIA/SEDATION: Moderate (conscious) sedation was employed during this procedure. A total of Versed 4 mg and Fentanyl 250 mcg was administered intravenously. Moderate Sedation Time: 36 minutes. The patient's level of consciousness and vital signs were monitored continuously by radiology nursing throughout the procedure under my direct supervision.  CONTRAST:  None COMPLICATIONS: None immediate. PROCEDURE: Informed written consent was obtained from the patient after a discussion of the risks, benefits and alternatives to treatment. The patient was placed supine on the CT gantry and a pre procedural CT was performed re-demonstrating the known abscess/fluid collection within the right lobe of the liver with dominant ill-defined component measuring at least 10.0 x 8.6 cm (image 33, series 2). The procedure was planned. A timeout was performed prior to the initiation of the procedure. The skin overlying the right upper abdominal quadrant was prepped and draped in the usual sterile fashion. The overlying soft tissues were anesthetized with 1% lidocaine with epinephrine. Under direct ultrasound guidance, the dominant hypoechoic component within the cranial medial aspect of the multiloculated abscess was cannulated with an 18 gauge trocar needle. An Amplatz wire was coiled within the dominant component of the abscess. Appropriate position was confirmed with CT imaging and the track was serially dilated allowing placement of a 12 French percutaneous drainage catheter. Appropriate position was confirmed with a limited CT scan. Next, the dominant hypoechoic component within in the inferolateral aspect of the multiloculated abscess was cannulated with an 18 gauge trocar needle. An Amplatz wire was coiled within this component of the abscess. Appropriate position was confirmed with CT imaging and the track was serially dilated allowing placement of a 12 French percutaneous drainage catheter. Appropriate positioning was confirmed with a limited CT scan. Ultimately, approximately 200 cc of purulent, slightly blood tinged fluid was aspirated from the abscess. Both hepatic abscess drainage catheters were connected to JP bulb is and sutured in place. Dressings were placed. The patient tolerated the above procedures well without immediate postprocedural complication. IMPRESSION:  Successful ultrasound and CT-guided placement of two 12 French percutaneous hepatic abscess drainage catheters. One of the hepatic abscess drainage catheters is coiled within the dominant component within the cranial-medial aspect of the multiloculated hepatic abscess while the additional drainage catheter is coiled within the dominant component of the inferolateral aspect of the hepatic abscess. Samples were sent to the laboratory as requested by the ordering clinical team. Electronically Signed   By: Sandi Mariscal M.D.   On: 01/25/2017 13:37  Labs:  CBC: Recent Labs    01/23/17 2024 01/25/17 0605 01/26/17 0622 01/27/17 0551  WBC 20.2* 21.6* 16.2* 12.3*  HGB 9.5* 9.5* 8.8* 9.1*  HCT 27.4* 28.0* 25.9* 26.6*  PLT 383 519* 587* 644*    COAGS: Recent Labs    01/25/17 0925  INR 1.37    BMP: Recent Labs    01/23/17 2024 01/25/17 0925 01/26/17 0622 01/27/17 0551  NA 132* 140 136 138  K 2.9* 3.3* 3.0* 3.5  CL 98* 111 106 106  CO2 22 23 23 24   GLUCOSE 86 92 80 80  BUN 13 15 12 8   CALCIUM 7.6* 7.9* 7.7* 7.7*  CREATININE 0.65 0.63 0.58 0.52  GFRNONAA >60 >60 >60 >60  GFRAA >60 >60 >60 >60    LIVER FUNCTION TESTS: Recent Labs    01/23/17 2024 01/26/17 0622 01/27/17 0551  BILITOT 2.6* 1.4* 1.4*  AST 68* 52* 43*  ALT 71* 66* 57*  ALKPHOS 119 114 117  PROT 7.0 5.9* 5.9*  ALBUMIN 2.2* 1.8* 1.8*    Assessment and Plan: 1. Liver abscess, s/p perc drain x 2  Drains in place and draining well.  CX shows pansensitive E. Coli.  abx therapy per primary service.  If the patient is stable for DC soon, we can arrange outpatient follow up for repeat CT scan etc within the next 7-10 days. Will follow while here.  Electronically Signed: Henreitta Cea 01/27/2017, 4:14 PM   I spent a total of 15 Minutes at the the patient's bedside AND on the patient's hospital floor or unit, greater than 50% of which was counseling/coordinating care for liver abscess

## 2017-01-27 NOTE — Progress Notes (Signed)
[facility] Infusion Coordinator will follow pt with ID team, Dr. Baxter Flattery, to [facility] at [facility] for home IV ABX.  If patient discharges after hours, please call [phone].   Larry Sierras 01/27/2017, 8:59 PM

## 2017-01-28 DIAGNOSIS — L270 Generalized skin eruption due to drugs and medicaments taken internally: Secondary | ICD-10-CM

## 2017-01-28 DIAGNOSIS — A498 Other bacterial infections of unspecified site: Secondary | ICD-10-CM

## 2017-01-28 DIAGNOSIS — F419 Anxiety disorder, unspecified: Secondary | ICD-10-CM

## 2017-01-28 DIAGNOSIS — K831 Obstruction of bile duct: Secondary | ICD-10-CM

## 2017-01-28 DIAGNOSIS — K750 Abscess of liver: Principal | ICD-10-CM

## 2017-01-28 LAB — CBC
HCT: 28.2 % — ABNORMAL LOW (ref 36.0–46.0)
HEMOGLOBIN: 9.3 g/dL — AB (ref 12.0–15.0)
MCH: 28.7 pg (ref 26.0–34.0)
MCHC: 33 g/dL (ref 30.0–36.0)
MCV: 87 fL (ref 78.0–100.0)
PLATELETS: 446 10*3/uL — AB (ref 150–400)
RBC: 3.24 MIL/uL — AB (ref 3.87–5.11)
RDW: 14.9 % (ref 11.5–15.5)
WBC: 13.7 10*3/uL — ABNORMAL HIGH (ref 4.0–10.5)

## 2017-01-28 LAB — COMPREHENSIVE METABOLIC PANEL
ALBUMIN: 2 g/dL — AB (ref 3.5–5.0)
ALK PHOS: 114 U/L (ref 38–126)
ALT: 46 U/L (ref 14–54)
ANION GAP: 6 (ref 5–15)
AST: 36 U/L (ref 15–41)
BUN: 6 mg/dL (ref 6–20)
CALCIUM: 7.8 mg/dL — AB (ref 8.9–10.3)
CHLORIDE: 103 mmol/L (ref 101–111)
CO2: 25 mmol/L (ref 22–32)
Creatinine, Ser: 0.49 mg/dL (ref 0.44–1.00)
GFR calc non Af Amer: >60 mL/min (ref >60)
GLUCOSE: 85 mg/dL (ref 65–99)
Potassium: 4.2 mmol/L (ref 3.5–5.1)
SODIUM: 134 mmol/L — AB (ref 135–145)
Total Bilirubin: 1.2 mg/dL (ref 0.3–1.2)
Total Protein: 5.9 g/dL — ABNORMAL LOW (ref 6.5–8.1)

## 2017-01-28 MED ORDER — SODIUM CHLORIDE 0.9% FLUSH
10.0000 mL | INTRAVENOUS | Status: DC | PRN
  Administered 2017-01-29: 10 mL
  Administered 2017-01-30: 20 mL
  Filled 2017-01-28 (×2): qty 40

## 2017-01-28 MED ORDER — HYDROXYZINE HCL 25 MG PO TABS
25.0000 mg | ORAL_TABLET | Freq: Three times a day (TID) | ORAL | Status: DC | PRN
  Administered 2017-01-28 – 2017-01-30 (×4): 25 mg via ORAL
  Filled 2017-01-28 (×5): qty 1

## 2017-01-28 MED ORDER — HYDROXYZINE HCL 25 MG PO TABS: 25.0000 mg | ORAL_TABLET | Freq: Three times a day (TID) | ORAL | Status: DC | PRN

## 2017-01-28 MED ORDER — HYDROXYZINE HCL 10 MG PO TABS
10.0000 mg | ORAL_TABLET | Freq: Three times a day (TID) | ORAL | Status: DC
  Filled 2017-01-28 (×2): qty 1

## 2017-01-28 MED ORDER — SODIUM CHLORIDE 0.9 % IV SOLN
1.0000 g | INTRAVENOUS | Status: DC
  Administered 2017-01-29 – 2017-01-30 (×3): 1 g via INTRAVENOUS
  Filled 2017-01-28 (×3): qty 1

## 2017-01-28 MED ORDER — METHYLPREDNISOLONE SODIUM SUCC 125 MG IJ SOLR
60.0000 mg | Freq: Once | INTRAMUSCULAR | Status: AC
  Administered 2017-01-28: 60 mg via INTRAVENOUS
  Filled 2017-01-28: qty 2

## 2017-01-28 MED ORDER — TRAMADOL HCL 50 MG PO TABS
50.0000 mg | ORAL_TABLET | Freq: Four times a day (QID) | ORAL | Status: DC | PRN
  Administered 2017-01-29: 50 mg via ORAL
  Filled 2017-01-28: qty 1

## 2017-01-28 MED ORDER — HYDROCORTISONE 1 % EX CREA
TOPICAL_CREAM | Freq: Three times a day (TID) | CUTANEOUS | Status: DC | PRN
  Filled 2017-01-28: qty 28

## 2017-01-28 NOTE — Progress Notes (Signed)
[facility] PROGRESS NOTE Subjective Patient feeling better having some sweating but no chills.  Slight tenderness around the drain site.  Overall feels much better.  Objective: Vital signs in last 24 hours: Temp:  [98.1 F (36.7 C)-98.3 F (36.8 C)] 98.3 F (36.8 C) (01/03 0544) Pulse Rate:  [94-99] 94 (01/03 0544) Resp:  [19-20] 19 (01/03 0544) BP: (101-122)/(66-78) 122/69 (01/03 0544) SpO2:  [96 %-98 %] 96 % (01/03 0544) Last BM Date: 01/26/17  Intake/Output from previous day: 01/02 0701 - 01/03 0700 In: 380 [P.O.:360] Out: 30 [Drains:30] Intake/Output this shift: Total I/O In: 240 [P.O.:240] Out: -   PE: General--sitting up in chair reading  Abdomen--minimally tender, nondistended good bowel sounds.  Lab Results: Recent Labs    01/26/17 0622 01/27/17 0551 01/28/17 0551  WBC 16.2* 12.3* 13.7*  HGB 8.8* 9.1* 9.3*  HCT 25.9* 26.6* 28.2*  PLT 587* 644* 446*   BMET Recent Labs    01/26/17 0622 01/27/17 0551 01/28/17 0551  NA 136 138 134*  K 3.0* 3.5 4.2  CL 106 106 103  CO2 23 24 25   CREATININE 0.58 0.52 0.49   LFT Recent Labs    01/26/17 0622 01/27/17 0551 01/28/17 0551  PROT 5.9* 5.9* 5.9*  AST 52* 43* 36  ALT 66* 57* 46  ALKPHOS 114 117 114  BILITOT 1.4* 1.4* 1.2   PT/INR No results for input(s): LABPROT, INR in the last 72 hours. PANCREAS No results for input(s): LIPASE in the last 72 hours.       Studies/Results: No results found.  Medications: I have reviewed the patient's current medications.  Assessment:   1.  Hepatic abscess.  Has percutaneous drain in place.  ID on board patient currently being treated with IV antibiotics and will likely need this for some time as outpatient.  IR planning to repeat CT scan in the next 7-10 days. 2. Dilated intrahepatic bile duct.  Patient has had a history of extensive surgery at [facility] for repair of bile duct injury with 1 year of subsequent percutaneous dilatations.   Fortunately, Labs are better total bilirubin now normal.  Not sure if this dilated bile duct is chronic or acute or how it may be related to her hepatic abscess.  All of this happened 20 years ago.   Plan: 1.  Will continue antibiotics to treat the acute infection per IR and ID. 2.  I have obtained a consent form and fax number for medical records at [facility] and will send this in and see if we can have the op note and discharge summary from that procedure about 20 years ago sent to our office.  Have discussed with the patient and she has signed a release form.   Nancy Fetter 01/28/2017, 11:56 AM  This note was created using voice recognition software. Minor errors may Have occurred unintentionally.  Pager: [phone] If no answer or after hours call [phone]

## 2017-01-28 NOTE — Progress Notes (Signed)
PHARMACY CONSULT NOTE FOR:  OUTPATIENT  PARENTERAL ANTIBIOTIC THERAPY (OPAT)  Indication: hepatic abscess Regimen: ertapenem 1g IV q24h End date: 02/15/17  IV antibiotic discharge orders are pended. To discharging provider:  please sign these orders via discharge navigator,  Select New Orders & click on the button choice - Manage This Unsigned Work.     Thank you for allowing pharmacy to be a part of this patient's care.  Peggyann Juba, PharmD, BCPS 01/28/2017, 6:06 PM

## 2017-01-28 NOTE — Progress Notes (Addendum)
PROGRESS NOTE    Molly Baker  XLK:[id number] DOB: 04-08-1970 DOA: 01/23/2017 PCP: Hulan Fess, MD    Brief Narrative: [name] D65 y.o.femalewith a history of remote cholecystectomy complicated by CBD injury requiring reconstruction and obesity who presented to the ED with 2 weeks of intermittent shaking chills and abdominal pain initially attributed to chronic intermittent pain brought on by stress. The symptoms continued and worsened in severity over 2 weeks despite being on a cruise in the [place], prompting presentation to the ED when she returned. On arrival she was febrile to 102.70F, with leukocytosis 20.2k and epigastric tenderness. CT demonstrated a large complex mass in the right lobe of the liver with mild surrounding edema. Empiric antibiotics were started for presumed abscess and IR consulted for drainage, but recommended MRI for further characterization.Following MRI, drainage was performed, culture sent, and 2 JP drains left in place 12/31. Cultures have grown GNR's,  E coli pan sensitive,  Further cultures are pending. She was started on meropenam and she seems to be improving on meropenem. GI is consulted for biliary-enteric anastomotic stenosis seen on imaging.     Assessment & Plan:   Principal Problem:   Hepatic abscess Active Problems:   History of common bile duct surgery   Biliary anastomotic stenosis   Complex Hepatic abscess s/p CT guided drain placement.  H/o previous abdominal surgery in the past at [facility].  Purulent abscess growing Ecoli and gram positive cocci.  Further sensitivities are pending. IR following the JP output.  Pain control .   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  Bilirubin stable.  Gi CONSULTED and awaiting records.    Pericardiophrenic and porta hepatis adenopathy:  Reactive.  Follow up with MRI  of the abdomen with and without contrast in 3 months.    Maculopapular erythematous rash:  Suspect from the allergic reaction to pcn's, pain meds.  No evidence of airway compromise.  One dose of  Solumedrol given, start prednisone in am if rash doesn't get better. and added atarax , in addition to benadryl.   Depression:  Stable.   GERD:  Resume PPI.   Leukocytosis: probably from the abscess.   Anemia of chronic disease:  Hemoglobin stable around 9.    Thrombocytosis: Reactive from abscess.   Hypokalemia replaced.     DVT prophylaxis: SCD'S Code Status: full code.  Family Communication: NONE AT BEDSIDE.  Disposition Plan:  Pending abscess culture report.    Consultants:   IR  ID  GI consult.    Procedures: PICC line.   CT guided JP drain.   Antimicrobials: Meropenam to ertepenam on discharge.   Subjective: Worsening rash.   Objective: Vitals:   01/27/17 1451 01/27/17 2145 01/28/17 0544 01/28/17 1403  BP: 101/78 108/66 122/69 112/67  Pulse: 97 99 94 92  Resp: 20 20 19 20   Temp: 98.1 F (36.7 C) 98.2 F (36.8 C) 98.3 F (36.8 C) 98.2 F (36.8 C)  TempSrc: Oral Oral Oral Oral  SpO2: 98% 97% 96% 96%  Weight:      Height:        Intake/Output Summary (Last 24 hours) at 01/28/2017 1836 Last data filed at 01/28/2017 1530 Gross per 24 hour  Intake 500 ml  Output 30 ml  Net 470 ml   Filed Weights   01/23/17 1922  Weight: 90.7 kg (200 lb)    Examination:  General exam: Appears calm and comfortable  Respiratory system: Clear to auscultation. Respiratory effort normal. Cardiovascular system: S1 & S2 heard, RRR. No JVD, murmurs, rubs, gallops or clicks. No pedal edema. Gastrointestinal system: Abdomen is nondistended, soft and mildly tender. JP drains in place.  Central nervous system: Alert and oriented. No focal neurological deficits. Extremities: Symmetric 5 x 5 power. Skin: maculopapular erythematous rash.  Psychiatry: . Mood & affect  appropriate.     Data Reviewed: I have personally reviewed following labs and imaging studies  CBC: Recent Labs  Lab 01/23/17 2024 01/25/17 0605 01/26/17 0622 01/27/17 0551 01/28/17 0551  WBC 20.2* 21.6* 16.2* 12.3* 13.7*  NEUTROABS 16.4*  --   --   --   --   HGB 9.5* 9.5* 8.8* 9.1* 9.3*  HCT 27.4* 28.0* 25.9* 26.6* 28.2*  MCV 82.3 85.1 84.6 85.3 87.0  PLT 383 519* 587* 644* 536*   Basic Metabolic Panel: Recent Labs  Lab 01/23/17 2024 01/25/17 0925 01/26/17 0622 01/27/17 0551 01/28/17 0551  NA 132* 140 136 138 134*  K 2.9* 3.3* 3.0* 3.5 4.2  CL 98* 111 106 106 103  CO2 22 23 23 24 25   GLUCOSE 86 92 80 80 85  BUN 13 15 12 8 6   CREATININE 0.65 0.63 0.58 0.52 0.49  CALCIUM 7.6* 7.9* 7.7* 7.7* 7.8*  MG 2.2  --   --   --   --    GFR: Estimated Creatinine Clearance: 94.8 mL/min (by C-G formula based on SCr of 0.49 mg/dL). Liver Function Tests: Recent Labs  Lab 01/23/17 2024 01/26/17 0622 01/27/17 0551 01/28/17 0551  AST 68* 52* 43* 36  ALT 71* 66* 57* 46  ALKPHOS 119 114 117 114  BILITOT 2.6* 1.4* 1.4* 1.2  PROT 7.0 5.9* 5.9* 5.9*  ALBUMIN 2.2* 1.8* 1.8* 2.0*   Recent Labs  Lab 01/23/17 2024  LIPASE 46   No results for input(s): AMMONIA in the last 168 hours. Coagulation Profile: Recent Labs  Lab 01/25/17 0925  INR 1.37   Cardiac Enzymes: No results for input(s): CKTOTAL, CKMB, CKMBINDEX, TROPONINI in the last 168 hours. BNP (last 3 results) No results for input(s): PROBNP in the last 8760 hours. HbA1C: No results for input(s): HGBA1C in the last 72 hours. CBG: No results for input(s): GLUCAP in the last 168 hours. Lipid Profile: No results for input(s): CHOL, HDL, LDLCALC, TRIG, CHOLHDL, LDLDIRECT in the last 72 hours. Thyroid Function Tests: No results for input(s): TSH, T4TOTAL, FREET4, T3FREE, THYROIDAB in the last 72 hours. Anemia Panel: No results for input(s): VITAMINB12, FOLATE, FERRITIN, TIBC, IRON, RETICCTPCT in the last 72  hours. Sepsis Labs: No results for input(s): PROCALCITON, LATICACIDVEN in the last 168 hours.  Recent Results (from the past 240 hour(s))  Aerobic/Anaerobic Culture (surgical/deep wound)     Status: None (Preliminary result)   Collection Time: 01/25/17  1:50 PM  Result Value Ref Range Status   Specimen Description ABSCESS LIVER  Final   Special Requests Normal  Final   Gram Stain   Final    ABUNDANT WBC PRESENT,BOTH PMN AND MONONUCLEAR MODERATE GRAM POSITIVE COCCI IN PAIRS IN CHAINS RARE GRAM VARIABLE ROD Performed at [facility], [place] [address], [place], [place] [address]    Culture   Final    MODERATE ESCHERICHIA COLI MODERATE VIRIDANS STREPTOCOCCUS NO ANAEROBES ISOLATED; CULTURE IN PROGRESS FOR 5 DAYS    Report Status PENDING  Incomplete   Organism ID, Bacteria ESCHERICHIA COLI  Final  Susceptibility   Escherichia coli - MIC*    AMPICILLIN 4 SENSITIVE Sensitive     CEFAZOLIN <=4 SENSITIVE Sensitive     CEFEPIME <=1 SENSITIVE Sensitive     CEFTAZIDIME <=1 SENSITIVE Sensitive     CEFTRIAXONE <=1 SENSITIVE Sensitive     CIPROFLOXACIN <=0.25 SENSITIVE Sensitive     GENTAMICIN <=1 SENSITIVE Sensitive     IMIPENEM <=0.25 SENSITIVE Sensitive     TRIMETH/SULFA <=20 SENSITIVE Sensitive     AMPICILLIN/SULBACTAM <=2 SENSITIVE Sensitive     PIP/TAZO <=4 SENSITIVE Sensitive     Extended ESBL NEGATIVE Sensitive     * MODERATE ESCHERICHIA COLI         Radiology Studies: No results found.      Scheduled Meds: . buPROPion  300 mg Oral QPC breakfast  . pantoprazole  80 mg Oral QHS  . polyethylene glycol  17 g Oral Daily  . senna  1 tablet Oral Daily  . sodium chloride flush  5 mL Intravenous Q8H   Continuous Infusions: . [START ON 01/29/2017] ertapenem       LOS: 5 days    Time spent: 46 min    Hosie Poisson, MD [facility] Pager [phone] If 7PM-7AM, please contact night-coverage [email or web address] Password [facility] 01/28/2017, 6:36 PM

## 2017-01-28 NOTE — Progress Notes (Addendum)
[facility]    Date of Admission:  01/23/2017   Total days of antibiotics 6          ID: Molly Baker is a 47 y.o. female with  Hepatic abscess Principal Problem:   Hepatic abscess Active Problems:   History of common bile duct surgery   Biliary anastomotic stenosis    Subjective: Having diffuse rash and itchiness from fentanyl she thinks. Involving back and legs  Medications:  . buPROPion  300 mg Oral QPC breakfast  . pantoprazole  80 mg Oral QHS  . polyethylene glycol  17 g Oral Daily  . senna  1 tablet Oral Daily  . sodium chloride flush  5 mL Intravenous Q8H    Objective: Vital signs in last 24 hours: Temp:  [98.2 F (36.8 C)-98.3 F (36.8 C)] 98.2 F (36.8 C) (01/03 1403) Pulse Rate:  [92-99] 92 (01/03 1403) Resp:  [19-20] 20 (01/03 1403) BP: (108-122)/(66-69) 112/67 (01/03 1403) SpO2:  [96 %-97 %] 96 % (01/03 1403) Physical Exam  Constitutional:  oriented to person, place, and time. appears well-developed and well-nourished. No distress.  HENT: [place]/AT, PERRLA, no scleral icterus Mouth/Throat: Oropharynx is clear and moist. No oropharyngeal exudate.  Cardiovascular: Normal rate, regular rhythm and normal heart sounds. Exam reveals no gallop and no friction rub.  No murmur heard.  Pulmonary/Chest: Effort normal and breath sounds normal. No respiratory distress.  has no wheezes.  Neck = supple, no nuchal rigidity Abdominal: Soft. Bowel sounds are normal.  exhibits no distension. There is no tenderness. 2 RUQ drains in place, still draining fluid Lymphadenopathy: no cervical adenopathy. No axillary adenopathy Neurological: alert and oriented to person, place, and time.  Skin: diffuse blanching macular papular rash to torso/back and legs, scattered about arms. Spares face Psychiatric: a normal mood and affect.  behavior is normal.    Lab Results Recent Labs    01/27/17 0551 01/28/17 0551  WBC 12.3* 13.7*  HGB 9.1* 9.3*  HCT  26.6* 28.2*  NA 138 134*  K 3.5 4.2  CL 106 103  CO2 24 25  BUN 8 6  CREATININE 0.52 0.49   Liver Panel Recent Labs    01/27/17 0551 01/28/17 0551  PROT 5.9* 5.9*  ALBUMIN 1.8* 2.0*  AST 43* 36  ALT 57* 46  ALKPHOS 117 114  BILITOT 1.4* 1.2   No results found for: [place]  Microbiology: ecoli and strep viridans group Studies/Results: No results found.   Assessment/Plan:   large hepatic abscess, polymicrobial. Responding to meropenem  Micro results show viridans strep plus ecoli from hepatic abscess culture  -recommend to place picc line - plan to treat for minimum of 2 wk with IV possibly 3 wk pending on how long her drains are in place - will plan for 3 wks - will change to ertapenem for ease of dosing and ensure she can tolerate it - will discuss with [facility] home [facility] for IV abtx - home health orders listed below - defer drain management to IR team, anticipate to get back to drain clinic in 1 wk for repeat imaging to assess needs for drains - will check sed rate and crp tomorrow labs  Drug rash = has pruritis thought to be due to fentanyl. Will give atarax to help with symptoms. Appears responding to steroids  Anxiety = she would like anxiolytic to help with picc line placement   ---------------------------- Diagnosis: Hepatic abscess  Culture Result: ecoli, strep viridans group  Allergies  Allergen Reactions  . Ciprofloxacin Anaphylaxis  . Codeine Anaphylaxis  . Dilaudid [Hydromorphone Hcl] Anaphylaxis  . Morphine And Related Anaphylaxis  . Penicillins Anaphylaxis    Has patient had a PCN reaction causing immediate rash, facial/tongue/throat swelling, SOB or lightheadedness with hypotension: yes Has patient had a PCN reaction causing severe rash involving mucus membranes or skin necrosis: no Has patient had a PCN reaction that required hospitalization: yes Has patient had a PCN reaction occurring within the last 10 years: no If all  of the above answers are "NO", then may proceed with Cephalosporin use.   Marland Kitchen Percocet [Oxycodone-Acetaminophen] Anaphylaxis  . Phenergan [Promethazine Hcl] Anaphylaxis  . Zosyn [Piperacillin-Tazobactam In Dex] Anaphylaxis  . Flagyl [Metronidazole] Other (See Comments)    Flushing  . Rocephin [Ceftriaxone Sodium In Dextrose] Other (See Comments)    Flushing  . Toradol [Ketorolac Tromethamine]     RASH    OPAT Orders Discharge antibiotics: Per pharmacy protocol -ertapenem  Duration: 3 wk  End Date: Jan 21st  [place] Per Protocol:  Labs weekly while on IV antibiotics: _x_ CBC with differential _x_ BMP  x__ CRP- at end of treatment _x_ ESR at end of treatment   x__ Please pull PIC at completion of IV antibiotics __ Please leave PIC in place until doctor has seen patient or been notified  Fax weekly labs to [phone]  Clinic Follow Up Appt: 2-3 wk  @ [facility] Cell: [phone] Pager: [phone]  01/28/2017, 5:40 PM

## 2017-01-28 NOTE — Progress Notes (Signed)
Referring Physician(s): Grunz,R  Supervising Physician: Aletta Edouard  Patient Status:  [facility] - In-pt  Chief Complaint:  Hepatic abscesses  Subjective: Pt doing fairly well; can breathe a little better and has less abd discomfort since drains placed; denies N/V   Allergies: Ciprofloxacin; Codeine; Dilaudid [hydromorphone hcl]; Morphine and related; Penicillins; Percocet [oxycodone-acetaminophen]; Phenergan [promethazine hcl]; Zosyn [piperacillin-tazobactam in dex]; Flagyl [metronidazole]; Rocephin [ceftriaxone sodium in dextrose]; and Toradol [ketorolac tromethamine]  Medications: Prior to Admission medications   Medication Sig Start Date End Date Taking? Authorizing Provider  buPROPion (WELLBUTRIN XL) 300 MG 24 hr tablet Take 300 mg by mouth daily after breakfast.    Yes [provider]  diphenhydrAMINE (BENADRYL) 25 MG tablet Take 25 mg by mouth every 6 (six) hours as needed for allergies.    Yes [provider]  esomeprazole (NEXIUM) 20 MG capsule Take 40 mg by mouth at bedtime.   Yes [provider]  ibuprofen (ADVIL,MOTRIN) 200 MG tablet Take 200 mg by mouth every 6 (six) hours as needed for headache, mild pain or moderate pain.    Yes [provider]     Vital Signs: BP 112/67 (BP Location: Right Arm)   Pulse 92   Temp 98.2 F (36.8 C) (Oral)   Resp 20   Ht 5\' 4"  (1.626 m)   Wt 200 lb (90.7 kg)   LMP 04/03/2015   SpO2 96%   BMI 34.33 kg/m   Physical Exam hepatic drains intact, insertion site clean and dry, not sig tender, outputs 15 cc each today turbid, blood-tinged fluid; cx- e coli/viridans strept  Imaging: Mr Liver W Wo Contrast  Result Date: 01/25/2017 CLINICAL DATA:  47 year old female inpatient admitted with epigastric abdominal pain, nausea and fever with right liver dome mass on CT. Remote cholecystectomy, reportedly complicated by bile duct injury. EXAM: MRI ABDOMEN WITHOUT AND WITH CONTRAST TECHNIQUE:  Multiplanar multisequence MR imaging of the abdomen was performed both before and after the administration of intravenous contrast. CONTRAST:  20 cc MultiHance IV. COMPARISON:  01/23/2017 CT abdomen/ pelvis. FINDINGS: Lower chest: Elevation of the right hemidiaphragm. Mild-to-moderate right basilar atelectasis. A few mildly enlarged right pericardiophrenic nodes measuring up to 1.0 cm (series 1003/ image 28). Hepatobiliary: Mild hepatomegaly. Moderate to severe diffuse hepatic steatosis. There is a 12.3 x 8.9 x 10.6 cm right superior liver mass with lobular contour centered within segment 8 of the right liver lobe, with extension into liver segments 4A and 7, which demonstrates thick enhancing wall, numerous thick enhancing internal septations and prominent restricted diffusion, compatible with an hepatic abscess. A few additional scattered subcentimeter foci of T2 hyperintensity are noted in the anterior right liver lobe, which demonstrate no appreciable enhancement. Status post cholecystectomy. There is prominent intrahepatic biliary ductal dilatation isolated to segments 5, 7 and 8 in the right liver lobe, with sharp tapering of the dilated intrahepatic right liver lobe bile ducts centrally near the porta hepatis, without an appreciable obstructing mass or stone in this location. There is evidence of a biliary enteric anastomosis associated with the biliary stricture site. Scattered low signal intensity in the nondependent portion of the dilated intrahepatic right liver lobe bile ducts is compatible with pneumobilia as seen on the CT study. The left liver lobe and inferior right liver lobe intrahepatic bile ducts are normal caliber. Common bile duct diameter 8 mm. No evidence of choledocholithiasis. Pancreas: No pancreatic mass or duct dilation.  No pancreas divisum. Spleen: Normal size. No mass. Adrenals/Urinary Tract: Normal adrenals.  Normal size kidneys. No hydronephrosis. There is a T2 hypointense 0.9 cm  renal cortical focus in the inferior right kidney (series 7/ image 56) without enhancement on the subtraction sequences, correlating with a focus of calcification on the recent CT study, most compatible with scarring or a calcified hemorrhagic cyst. Subcentimeter simple lower left renal cyst. No suspicious renal masses. Stomach/Bowel: Grossly normal stomach. Visualized small and large bowel is normal caliber, with no bowel wall thickening. Vascular/Lymphatic: Normal caliber abdominal aorta. Patent portal, splenic, hepatic and renal veins. Mild porta hepatis adenopathy measuring up to the 1.6 cm (series 1005/ image 61). Other: No ascites.  No additional fluid collections. Musculoskeletal: No aggressive appearing focal osseous lesions. IMPRESSION: 1. Large 12.3 cm right superior hepatic abscess, multilocular. Additional subcentimeter scattered T2 hyperintense nonenhancing right liver foci, probably additional tiny satellite abscesses or cysts. 2. Evidence of a high-grade stricture at the site of a biliary-enteric anastomosis draining segments 5, 7 and 8 of the right liver lobe. No evidence of obstructing mass or stone. 3. Normal caliber CBD (8 mm diameter) status post cholecystectomy. Left liver lobe and inferior right liver lobe bile ducts are normal caliber. 4. Nonspecific pericardiophrenic and porta hepatis adenopathy, probably reactive. Recommend post treatment MRI abdomen without and with IV contrast in 3 months . 5. Elevation of the right hemidiaphragm with right basilar atelectasis . 6. Moderate to severe diffuse hepatic steatosis. These results were called by telephone at the time of interpretation on 01/25/2017 at 12:20 pm to Dr. Ronny Bacon, who verbally acknowledged these results. Electronically Signed   By: Ilona Sorrel M.D.   On: 01/25/2017 12:22   Ct Image Guided Drainage Percut Cath  Peritoneal Retroperit  Result Date: 01/25/2017 INDICATION: Remote history of cholecystectomy complicated by a biliary  injury requiring creation of a hepaticojejunostomy complicated by a recurrent biliary stricture requiring prolonged biliary catheter placement with multiple subsequent biliary angioplasties (per pt report), all of which was performed at outside institutions. Patient now presents with right upper quadrant abdominal pain, fever and chills with cross-sectional imaging concerning for development of a large multi-locular abscess within the right lobe of the liver. Request made for percutaneous drainage catheter placement for infection source control purposes. EXAM: ULTRASOUND AND CT-GUIDED HEPATIC ABSCESS DRAINAGE CATHETER PLACEMENT X2 COMPARISON:  CT abdomen pelvis - 01/23/2017; abdominal MRI - 01/25/2017 MEDICATIONS: The patient is currently admitted to the hospital and receiving intravenous antibiotics. The antibiotics were administered within an appropriate time frame prior to the initiation of the procedure. ANESTHESIA/SEDATION: Moderate (conscious) sedation was employed during this procedure. A total of Versed 4 mg and Fentanyl 250 mcg was administered intravenously. Moderate Sedation Time: 36 minutes. The patient's level of consciousness and vital signs were monitored continuously by radiology nursing throughout the procedure under my direct supervision. CONTRAST:  None COMPLICATIONS: None immediate. PROCEDURE: Informed written consent was obtained from the patient after a discussion of the risks, benefits and alternatives to treatment. The patient was placed supine on the CT gantry and a pre procedural CT was performed re-demonstrating the known abscess/fluid collection within the right lobe of the liver with dominant ill-defined component measuring at least 10.0 x 8.6 cm (image 33, series 2). The procedure was planned. A timeout was performed prior to the initiation of the procedure. The skin overlying the right upper abdominal quadrant was prepped and draped in the usual sterile fashion. The overlying soft  tissues were anesthetized with 1% lidocaine with epinephrine. Under direct ultrasound guidance, the dominant hypoechoic component within the  cranial medial aspect of the multiloculated abscess was cannulated with an 18 gauge trocar needle. An Amplatz wire was coiled within the dominant component of the abscess. Appropriate position was confirmed with CT imaging and the track was serially dilated allowing placement of a 12 French percutaneous drainage catheter. Appropriate position was confirmed with a limited CT scan. Next, the dominant hypoechoic component within in the inferolateral aspect of the multiloculated abscess was cannulated with an 18 gauge trocar needle. An Amplatz wire was coiled within this component of the abscess. Appropriate position was confirmed with CT imaging and the track was serially dilated allowing placement of a 12 French percutaneous drainage catheter. Appropriate positioning was confirmed with a limited CT scan. Ultimately, approximately 200 cc of purulent, slightly blood tinged fluid was aspirated from the abscess. Both hepatic abscess drainage catheters were connected to JP bulb is and sutured in place. Dressings were placed. The patient tolerated the above procedures well without immediate postprocedural complication. IMPRESSION: Successful ultrasound and CT-guided placement of two 12 French percutaneous hepatic abscess drainage catheters. One of the hepatic abscess drainage catheters is coiled within the dominant component within the cranial-medial aspect of the multiloculated hepatic abscess while the additional drainage catheter is coiled within the dominant component of the inferolateral aspect of the hepatic abscess. Samples were sent to the laboratory as requested by the ordering clinical team. Electronically Signed   By: Sandi Mariscal M.D.   On: 01/25/2017 13:37   Ct Image Guided Drainage Percut Cath  Peritoneal Retroperit  Result Date: 01/25/2017 INDICATION: Remote history  of cholecystectomy complicated by a biliary injury requiring creation of a hepaticojejunostomy complicated by a recurrent biliary stricture requiring prolonged biliary catheter placement with multiple subsequent biliary angioplasties (per pt report), all of which was performed at outside institutions. Patient now presents with right upper quadrant abdominal pain, fever and chills with cross-sectional imaging concerning for development of a large multi-locular abscess within the right lobe of the liver. Request made for percutaneous drainage catheter placement for infection source control purposes. EXAM: ULTRASOUND AND CT-GUIDED HEPATIC ABSCESS DRAINAGE CATHETER PLACEMENT X2 COMPARISON:  CT abdomen pelvis - 01/23/2017; abdominal MRI - 01/25/2017 MEDICATIONS: The patient is currently admitted to the hospital and receiving intravenous antibiotics. The antibiotics were administered within an appropriate time frame prior to the initiation of the procedure. ANESTHESIA/SEDATION: Moderate (conscious) sedation was employed during this procedure. A total of Versed 4 mg and Fentanyl 250 mcg was administered intravenously. Moderate Sedation Time: 36 minutes. The patient's level of consciousness and vital signs were monitored continuously by radiology nursing throughout the procedure under my direct supervision. CONTRAST:  None COMPLICATIONS: None immediate. PROCEDURE: Informed written consent was obtained from the patient after a discussion of the risks, benefits and alternatives to treatment. The patient was placed supine on the CT gantry and a pre procedural CT was performed re-demonstrating the known abscess/fluid collection within the right lobe of the liver with dominant ill-defined component measuring at least 10.0 x 8.6 cm (image 33, series 2). The procedure was planned. A timeout was performed prior to the initiation of the procedure. The skin overlying the right upper abdominal quadrant was prepped and draped in the  usual sterile fashion. The overlying soft tissues were anesthetized with 1% lidocaine with epinephrine. Under direct ultrasound guidance, the dominant hypoechoic component within the cranial medial aspect of the multiloculated abscess was cannulated with an 18 gauge trocar needle. An Amplatz wire was coiled within the dominant component of the abscess. Appropriate position was  confirmed with CT imaging and the track was serially dilated allowing placement of a 12 French percutaneous drainage catheter. Appropriate position was confirmed with a limited CT scan. Next, the dominant hypoechoic component within in the inferolateral aspect of the multiloculated abscess was cannulated with an 18 gauge trocar needle. An Amplatz wire was coiled within this component of the abscess. Appropriate position was confirmed with CT imaging and the track was serially dilated allowing placement of a 12 French percutaneous drainage catheter. Appropriate positioning was confirmed with a limited CT scan. Ultimately, approximately 200 cc of purulent, slightly blood tinged fluid was aspirated from the abscess. Both hepatic abscess drainage catheters were connected to JP bulb is and sutured in place. Dressings were placed. The patient tolerated the above procedures well without immediate postprocedural complication. IMPRESSION: Successful ultrasound and CT-guided placement of two 12 French percutaneous hepatic abscess drainage catheters. One of the hepatic abscess drainage catheters is coiled within the dominant component within the cranial-medial aspect of the multiloculated hepatic abscess while the additional drainage catheter is coiled within the dominant component of the inferolateral aspect of the hepatic abscess. Samples were sent to the laboratory as requested by the ordering clinical team. Electronically Signed   By: Sandi Mariscal M.D.   On: 01/25/2017 13:37    Labs:  CBC: Recent Labs    01/25/17 0605 01/26/17 0622  01/27/17 0551 01/28/17 0551  WBC 21.6* 16.2* 12.3* 13.7*  HGB 9.5* 8.8* 9.1* 9.3*  HCT 28.0* 25.9* 26.6* 28.2*  PLT 519* 587* 644* 446*    COAGS: Recent Labs    01/25/17 0925  INR 1.37    BMP: Recent Labs    01/25/17 0925 01/26/17 0622 01/27/17 0551 01/28/17 0551  NA 140 136 138 134*  K 3.3* 3.0* 3.5 4.2  CL 111 106 106 103  CO2 23 23 24 25   GLUCOSE 92 80 80 85  BUN 15 12 8 6   CALCIUM 7.9* 7.7* 7.7* 7.8*  CREATININE 0.63 0.58 0.52 0.49  GFRNONAA >60 >60 >60 >60  GFRAA >60 >60 >60 >60    LIVER FUNCTION TESTS: Recent Labs    01/23/17 2024 01/26/17 0622 01/27/17 0551 01/28/17 0551  BILITOT 2.6* 1.4* 1.4* 1.2  AST 68* 52* 43* 36  ALT 71* 66* 57* 46  ALKPHOS 119 114 117 114  PROT 7.0 5.9* 5.9* 5.9*  ALBUMIN 2.2* 1.8* 1.8* 2.0*    Assessment and Plan: S/p hepatic abscess drains x2 12/31; afebrile; cx- e coli/viridans strept; WBC 13.7(12.3), hgb 9.3, creat/LFT's nl; cont current tx/drain irrigations; rec f/u CT within 1 week of drain placement; if d/c'd home soon can arrange for f/u CT at IR drain clinic [phone]  Electronically Signed: D. Rowe Robert, PA-C 01/28/2017, 4:17 PM   I spent a total of 15 minutes at the the patient's bedside AND on the patient's hospital floor or unit, greater than 50% of which was counseling/coordinating care for hepatic abscess drains    Patient ID: Molly Baker, female   DOB: [birth year], 47 y.o.   MRN: [id number]

## 2017-01-28 NOTE — Progress Notes (Signed)
Peripherally Inserted Central Catheter/Midline Placement  The IV Nurse has discussed with the patient and/or persons authorized to consent for the patient, the purpose of this procedure and the potential benefits and risks involved with this procedure.  The benefits include less needle sticks, lab draws from the catheter, and the patient may be discharged home with the catheter. Risks include, but not limited to, infection, bleeding, blood clot (thrombus formation), and puncture of an artery; nerve damage and irregular heartbeat and possibility to perform a PICC exchange if needed/ordered by physician.  Alternatives to this procedure were also discussed.  Bard Power PICC patient education guide, fact sheet on infection prevention and patient information card has been provided to patient /or left at bedside.    PICC/Midline Placement Documentation  PICC Single Lumen 59/74/16 PICC Right Basilic 35 cm 0 cm (Active)  Indication for Insertion or Continuance of Line Home intravenous therapies (PICC only) 01/28/2017  6:00 PM  Exposed Catheter (cm) 0 cm 01/28/2017  6:00 PM  Site Assessment Clean;Dry;Intact 01/28/2017  6:00 PM  Line Status Flushed;Blood return noted 01/28/2017  6:00 PM  Dressing Type Transparent 01/28/2017  6:00 PM  Dressing Status Clean;Dry;Intact;Antimicrobial disc in place 01/28/2017  6:00 PM  Dressing Intervention New dressing 01/28/2017  6:00 PM  Dressing Change Due 02/04/17 01/28/2017  6:00 PM       Christella Noa Albarece 01/28/2017, 6:41 PM

## 2017-01-29 DIAGNOSIS — K769 Liver disease, unspecified: Secondary | ICD-10-CM

## 2017-01-29 LAB — CBC
HEMATOCRIT: 27.7 % — AB (ref 36.0–46.0)
Hemoglobin: 9 g/dL — ABNORMAL LOW (ref 12.0–15.0)
MCH: 28.6 pg (ref 26.0–34.0)
MCHC: 32.5 g/dL (ref 30.0–36.0)
MCV: 87.9 fL (ref 78.0–100.0)
Platelets: 813 10*3/uL — ABNORMAL HIGH (ref 150–400)
RBC: 3.15 MIL/uL — ABNORMAL LOW (ref 3.87–5.11)
RDW: 15.1 % (ref 11.5–15.5)
WBC: 11.4 10*3/uL — ABNORMAL HIGH (ref 4.0–10.5)

## 2017-01-29 LAB — C-REACTIVE PROTEIN: CRP: 10.3 mg/dL — AB (ref <1.0)

## 2017-01-29 LAB — SEDIMENTATION RATE: Sed Rate: 90 mm/hr — ABNORMAL HIGH (ref 0–22)

## 2017-01-29 MED ORDER — METHYLPREDNISOLONE SODIUM SUCC 125 MG IJ SOLR
60.0000 mg | Freq: Once | INTRAMUSCULAR | Status: AC
  Administered 2017-01-29: 60 mg via INTRAVENOUS
  Filled 2017-01-29: qty 2

## 2017-01-29 MED ORDER — DIPHENHYDRAMINE HCL 50 MG/ML IJ SOLN
12.5000 mg | Freq: Once | INTRAMUSCULAR | Status: AC
  Administered 2017-01-29: 12.5 mg via INTRAVENOUS
  Filled 2017-01-29: qty 1

## 2017-01-29 MED ORDER — DIPHENHYDRAMINE HCL 50 MG/ML IJ SOLN
25.0000 mg | Freq: Three times a day (TID) | INTRAMUSCULAR | Status: DC | PRN
  Administered 2017-01-29 – 2017-01-30 (×3): 25 mg via INTRAVENOUS
  Filled 2017-01-29 (×3): qty 1

## 2017-01-29 MED ORDER — DIPHENHYDRAMINE HCL 50 MG/ML IJ SOLN
12.5000 mg | Freq: Three times a day (TID) | INTRAMUSCULAR | Status: DC | PRN
  Administered 2017-01-29: 12.5 mg via INTRAVENOUS
  Filled 2017-01-29: qty 1

## 2017-01-29 NOTE — Progress Notes (Signed)
Patient called nurse into room because she believes she is having another drug reaction, this time to tramadol. Patient has a rash to BUE, chest and back, no respiratory distres. PO benadryl given to patient with no relief. Provider on call notified to see if IV solumedrol can be ordered. Will continue to monitor.

## 2017-01-29 NOTE — Progress Notes (Signed)
GI Discharge/Sign-Off Note  Subjective: Patient clinically feels better.  She is receiving antibiotics and had some type of reaction.  Once I get this settled she is going to go home on a course of antibiotics with follow-up with IR for drainage of her hepatic abscess.  Principal Problem:   Hepatic abscess Active Problems:   History of common bile duct surgery   Biliary anastomotic stenosis   Results for orders placed or performed during the hospital encounter of 01/23/17 (from the past 72 hour(s))  CBC     Status: Abnormal   Collection Time: 01/27/17  5:51 AM  Result Value Ref Range   WBC 12.3 (H) 4.0 - 10.5 K/uL   RBC 3.12 (L) 3.87 - 5.11 MIL/uL   Hemoglobin 9.1 (L) 12.0 - 15.0 g/dL   HCT 26.6 (L) 36.0 - 46.0 %   MCV 85.3 78.0 - 100.0 fL   MCH 29.2 26.0 - 34.0 pg   MCHC 34.2 30.0 - 36.0 g/dL   RDW 14.6 11.5 - 15.5 %   Platelets 644 (H) 150 - 400 K/uL  Comprehensive metabolic panel     Status: Abnormal   Collection Time: 01/27/17  5:51 AM  Result Value Ref Range   Sodium 138 135 - 145 mmol/L   Potassium 3.5 3.5 - 5.1 mmol/L   Chloride 106 101 - 111 mmol/L   CO2 24 22 - 32 mmol/L   Glucose, Bld 80 65 - 99 mg/dL   BUN 8 6 - 20 mg/dL   Creatinine, Ser 0.52 0.44 - 1.00 mg/dL   Calcium 7.7 (L) 8.9 - 10.3 mg/dL   Total Protein 5.9 (L) 6.5 - 8.1 g/dL   Albumin 1.8 (L) 3.5 - 5.0 g/dL   AST 43 (H) 15 - 41 U/L   ALT 57 (H) 14 - 54 U/L   Alkaline Phosphatase 117 38 - 126 U/L   Total Bilirubin 1.4 (H) 0.3 - 1.2 mg/dL   GFR calc non Af Amer >60 >60 mL/min   GFR calc Af Amer >60 >60 mL/min    Comment: (NOTE) The eGFR has been calculated using the CKD EPI equation. This calculation has not been validated in all clinical situations. eGFR's persistently <60 mL/min signify possible Chronic Kidney Disease.    Anion gap 8 5 - 15  CBC     Status: Abnormal   Collection Time: 01/28/17  5:51 AM  Result Value Ref Range   WBC 13.7 (H) 4.0 - 10.5 K/uL   RBC 3.24 (L) 3.87 - 5.11 MIL/uL    Hemoglobin 9.3 (L) 12.0 - 15.0 g/dL   HCT 28.2 (L) 36.0 - 46.0 %   MCV 87.0 78.0 - 100.0 fL   MCH 28.7 26.0 - 34.0 pg   MCHC 33.0 30.0 - 36.0 g/dL   RDW 14.9 11.5 - 15.5 %   Platelets 446 (H) 150 - 400 K/uL  Comprehensive metabolic panel     Status: Abnormal   Collection Time: 01/28/17  5:51 AM  Result Value Ref Range   Sodium 134 (L) 135 - 145 mmol/L   Potassium 4.2 3.5 - 5.1 mmol/L   Chloride 103 101 - 111 mmol/L   CO2 25 22 - 32 mmol/L   Glucose, Bld 85 65 - 99 mg/dL   BUN 6 6 - 20 mg/dL   Creatinine, Ser 0.49 0.44 - 1.00 mg/dL   Calcium 7.8 (L) 8.9 - 10.3 mg/dL   Total Protein 5.9 (L) 6.5 - 8.1 g/dL   Albumin 2.0 (L) 3.5 -  5.0 g/dL   AST 36 15 - 41 U/L   ALT 46 14 - 54 U/L   Alkaline Phosphatase 114 38 - 126 U/L   Total Bilirubin 1.2 0.3 - 1.2 mg/dL   GFR calc non Af Amer >60 >60 mL/min   GFR calc Af Amer >60 >60 mL/min    Comment: (NOTE) The eGFR has been calculated using the CKD EPI equation. This calculation has not been validated in all clinical situations. eGFR's persistently <60 mL/min signify possible Chronic Kidney Disease.    Anion gap 6 5 - 15  CBC     Status: Abnormal   Collection Time: 01/29/17  4:00 AM  Result Value Ref Range   WBC 11.4 (H) 4.0 - 10.5 K/uL   RBC 3.15 (L) 3.87 - 5.11 MIL/uL   Hemoglobin 9.0 (L) 12.0 - 15.0 g/dL   HCT 27.7 (L) 36.0 - 46.0 %   MCV 87.9 78.0 - 100.0 fL   MCH 28.6 26.0 - 34.0 pg   MCHC 32.5 30.0 - 36.0 g/dL   RDW 15.1 11.5 - 15.5 %   Platelets 813 (H) 150 - 400 K/uL  Sedimentation rate     Status: Abnormal   Collection Time: 01/29/17  4:00 AM  Result Value Ref Range   Sed Rate 90 (H) 0 - 22 mm/hr  C-reactive protein     Status: Abnormal   Collection Time: 01/29/17  4:00 AM  Result Value Ref Range   CRP 10.3 (H) <1.0 mg/dL    Comment: Performed at [facility], [address] [place], [place] [address]    No results found.  @MEDSSCHEDLED @  GI DISCHARGE PLANNING:  Diet: As  tolerated  Anticoagulation/Antiplatelet Rx Suggestions:   GI Medications:   Labs/Procedures Ordered:  GI FOLLOW UP:  Call [phone] to make appointment.   Doctor: Oletta Lamas  Time: 1 month.  Records from [facility] have been requested and once the hepatic abscess has been drained adequately will need to image the biliary system.   Laurence Spates, MD   Pager [phone] If no answer or after hours call [phone]

## 2017-01-29 NOTE — Progress Notes (Signed)
PROGRESS NOTE    Molly Baker  ZTI:[id number] DOB: 1970-04-09 DOA: 01/23/2017 PCP: Hulan Fess, MD    Brief Narrative: Molly Baker A24 y.o.femalewith a history of remote cholecystectomy complicated by CBD injury requiring reconstruction and obesity who presented to the ED with 2 weeks of intermittent shaking chills and abdominal pain initially attributed to chronic intermittent pain brought on by stress. The symptoms continued and worsened in severity over 2 weeks despite being on a cruise in the [place], prompting presentation to the ED when she returned. On arrival she was febrile to 102.49F, with leukocytosis 20.2k and epigastric tenderness. CT demonstrated a large complex mass in the right lobe of the liver with mild surrounding edema. Empiric antibiotics were started for presumed abscess and IR consulted for drainage, but recommended MRI for further characterization.Following MRI, drainage was performed, culture sent, and 2 JP drains left in place 12/31. Cultures have grown GNR's,  E coli pan sensitive,  Further cultures are pending. She was started on meropenam and she seems to be improving on meropenem. GI is consulted for biliary-enteric anastomotic stenosis seen on imaging.     Assessment & Plan:   Principal Problem:   Hepatic abscess Active Problems:   History of common bile duct surgery   Biliary anastomotic stenosis   Complex Hepatic abscess s/p CT guided drain placement.  H/o previous abdominal surgery in the past at [facility] years ago?.  Purulent abscess growing Ecoli and viridans streptococci.  Further sensitivities are pending. IR following the JP output.  Pain control with tylenol.   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  Bilirubin stable.  Gi consulted and awaiting records.    Pericardiophrenic and porta hepatis adenopathy:    Reactive.  Follow up with MRI of the abdomen with and without contrast in 3 months.    Maculopapular erythematous rash:  Suspect from the allergic reaction to pcn's, pain meds.  No evidence of airway compromise.  Another dose of solumedrol given overnight and she is on IV benadryl prn for itching.   Depression:  Stable.   GERD:  Resume PPI.   Leukocytosis: probably from the abscess.   Anemia of chronic disease:  Hemoglobin stable around 9.    Thrombocytosis: Reactive from abscess.   Hypokalemia replaced.  Mild hyponatremia.     DVT prophylaxis: SCD'S Code Status: full code.  Family Communication: NONE AT BEDSIDE.  Disposition Plan:  Pending abscess culture report.    Consultants:   IR  ID  GI consult.    Procedures: PICC line.   CT guided JP drain.   Antimicrobials: Meropenam to ertepenam on discharge.   Subjective: Rash on the face improved with benadryl and solumedrol.    Objective: Vitals:   01/28/17 1403 01/28/17 2155 01/29/17 0431 01/29/17 1300  BP: 112/67 105/60 (!) 104/58 (!) 102/54  Pulse: 92 79 74 84  Resp: 20 19 19 20   Temp: 98.2 F (36.8 C) (!) 97.5 F (36.4 C) 97.8 F (36.6 C) 98.1 F (36.7 C)  TempSrc: Oral Oral Oral Oral  SpO2: 96% 94% 93% 98%  Weight:      Height:        Intake/Output Summary (Last 24 hours) at 01/29/2017 1427 Last data filed at 01/29/2017 1229 Gross per 24 hour  Intake 860 ml  Output 60 ml  Net 800 ml   [facility] Weights   01/23/17 1922  Weight: 90.7 kg (200 lb)    Examination:  General exam: Appears calm and comfortable not in any distress.  Respiratory system: Clear to auscultation. Respiratory effort normal. No wheezing orr honchi.  Cardiovascular system: S1 & S2 heard, RRR. No JVD, murmurs, . No pedal edema. Gastrointestinal system: Abdomen is nondistended, soft and mildly tender int he right upper quadrant.  JP drains in place.  Central nervous system: Alert and oriented. No focal neurological  deficits. Extremities: Symmetric 5 x 5 power. Skin: maculopapular erythematous rash improving on the face and chest.  Psychiatry: . Mood & affect appropriate.     Data Reviewed: I have personally reviewed following labs and imaging studies  CBC: Recent Labs  Lab 01/23/17 2024 01/25/17 0605 01/26/17 0622 01/27/17 0551 01/28/17 0551 01/29/17 0400  WBC 20.2* 21.6* 16.2* 12.3* 13.7* 11.4*  NEUTROABS 16.4*  --   --   --   --   --   HGB 9.5* 9.5* 8.8* 9.1* 9.3* 9.0*  HCT 27.4* 28.0* 25.9* 26.6* 28.2* 27.7*  MCV 82.3 85.1 84.6 85.3 87.0 87.9  PLT 383 519* 587* 644* 446* 314*   Basic Metabolic Panel: Recent Labs  Lab 01/23/17 2024 01/25/17 0925 01/26/17 0622 01/27/17 0551 01/28/17 0551  NA 132* 140 136 138 134*  K 2.9* 3.3* 3.0* 3.5 4.2  CL 98* 111 106 106 103  CO2 22 23 23 24 25   GLUCOSE 86 92 80 80 85  BUN 13 15 12 8 6   CREATININE 0.65 0.63 0.58 0.52 0.49  CALCIUM 7.6* 7.9* 7.7* 7.7* 7.8*  MG 2.2  --   --   --   --    GFR: Estimated Creatinine Clearance: 94.8 mL/min (by C-G formula based on SCr of 0.49 mg/dL). Liver Function Tests: Recent Labs  Lab 01/23/17 2024 01/26/17 0622 01/27/17 0551 01/28/17 0551  AST 68* 52* 43* 36  ALT 71* 66* 57* 46  ALKPHOS 119 114 117 114  BILITOT 2.6* 1.4* 1.4* 1.2  PROT 7.0 5.9* 5.9* 5.9*  ALBUMIN 2.2* 1.8* 1.8* 2.0*   Recent Labs  Lab 01/23/17 2024  LIPASE 46   No results for input(s): AMMONIA in the last 168 hours. Coagulation Profile: Recent Labs  Lab 01/25/17 0925  INR 1.37   Cardiac Enzymes: No results for input(s): CKTOTAL, CKMB, CKMBINDEX, TROPONINI in the last 168 hours. BNP (last 3 results) No results for input(s): PROBNP in the last 8760 hours. HbA1C: No results for input(s): HGBA1C in the last 72 hours. CBG: No results for input(s): GLUCAP in the last 168 hours. Lipid Profile: No results for input(s): CHOL, HDL, LDLCALC, TRIG, CHOLHDL, LDLDIRECT in the last 72 hours. Thyroid Function Tests: No  results for input(s): TSH, T4TOTAL, FREET4, T3FREE, THYROIDAB in the last 72 hours. Anemia Panel: No results for input(s): VITAMINB12, FOLATE, FERRITIN, TIBC, IRON, RETICCTPCT in the last 72 hours. Sepsis Labs: No results for input(s): PROCALCITON, LATICACIDVEN in the last 168 hours.  Recent Results (from the past 240 hour(s))  Aerobic/Anaerobic Culture (surgical/deep wound)     Status: None (Preliminary result)   Collection Time: 01/25/17  1:50 PM  Result Value Ref Range Status   Specimen Description ABSCESS LIVER  Final   Special Requests Normal  Final   Gram Stain   Final    ABUNDANT WBC PRESENT,BOTH PMN AND MONONUCLEAR MODERATE GRAM POSITIVE COCCI IN PAIRS IN CHAINS RARE GRAM VARIABLE ROD Performed at [facility], [place] [address], [place], [place] [address]    Culture  Final    MODERATE ESCHERICHIA COLI MODERATE VIRIDANS STREPTOCOCCUS NO ANAEROBES ISOLATED; CULTURE IN PROGRESS FOR 5 DAYS    Report Status PENDING  Incomplete   Organism ID, Bacteria ESCHERICHIA COLI  Final      Susceptibility   Escherichia coli - MIC*    AMPICILLIN 4 SENSITIVE Sensitive     CEFAZOLIN <=4 SENSITIVE Sensitive     CEFEPIME <=1 SENSITIVE Sensitive     CEFTAZIDIME <=1 SENSITIVE Sensitive     CEFTRIAXONE <=1 SENSITIVE Sensitive     CIPROFLOXACIN <=0.25 SENSITIVE Sensitive     GENTAMICIN <=1 SENSITIVE Sensitive     IMIPENEM <=0.25 SENSITIVE Sensitive     TRIMETH/SULFA <=20 SENSITIVE Sensitive     AMPICILLIN/SULBACTAM <=2 SENSITIVE Sensitive     PIP/TAZO <=4 SENSITIVE Sensitive     Extended ESBL NEGATIVE Sensitive     * MODERATE ESCHERICHIA COLI         Radiology Studies: No results found.      Scheduled Meds: . buPROPion  300 mg Oral QPC breakfast  . pantoprazole  80 mg Oral QHS  . polyethylene glycol  17 g Oral Daily  . senna  1 tablet Oral Daily  . sodium chloride flush  5 mL Intravenous Q8H   Continuous Infusions: . ertapenem Stopped (01/29/17 0329)     LOS: 6 days     Time spent: 84 min    Hosie Poisson, MD [facility] Pager [phone] If 7PM-7AM, please contact night-coverage [email or web address] Password [facility] 01/29/2017, 2:27 PM

## 2017-01-29 NOTE — Progress Notes (Signed)
    [facility] for Infectious Disease    Date of Admission:  01/23/2017   Total days of antibiotics 7        Day 2 ertapenem           ID: Molly Baker is a 47 y.o. female with multiple drug allergies and hx of CBD stenting /biliary anastomosis stenosis , admitted for  Principal Problem:   Hepatic abscess Active Problems:   History of common bile duct surgery   Biliary anastomotic stenosis    Subjective: Had recurrence of rash early this morning that she attributed to tramadol, but she had also received a dose of ertapenem. Complains of itchiness. Has been taking atarax nad iv benadryl to alleviate symptoms. She is also noticing that drainage in jp bulb is more green than in the previous day Medications:  . buPROPion  300 mg Oral QPC breakfast  . pantoprazole  80 mg Oral QHS  . polyethylene glycol  17 g Oral Daily  . senna  1 tablet Oral Daily  . sodium chloride flush  5 mL Intravenous Q8H    Objective: Vital signs in last 24 hours: Temp:  [97.5 F (36.4 C)-98.2 F (36.8 C)] 97.8 F (36.6 C) (01/04 0431) Pulse Rate:  [74-92] 74 (01/04 0431) Resp:  [19-20] 19 (01/04 0431) BP: (104-112)/(58-67) 104/58 (01/04 0431) SpO2:  [93 %-96 %] 93 % (01/04 0431) Physical Exam  Constitutional:  oriented to person, place, and time. appears well-developed and well-nourished. No distress.  HENT: [place]/AT, PERRLA, no scleral icterus, face is flushed with macular papular rash Mouth/Throat: Oropharynx is clear and moist. No oropharyngeal exudate.  Abdominal: Soft. Bowel sounds are normal.  exhibits no distension. There is no tenderness. Right sided 2 jp drains. One serosanginous, other bulb bilious Skin: arms, torso, face more affected with macularpapular rash than legs Psychiatric: a normal mood and affect.  behavior is normal.    Lab Results Recent Labs    01/27/17 0551 01/28/17 0551 01/29/17 0400  WBC 12.3* 13.7* 11.4*  HGB 9.1* 9.3* 9.0*  HCT 26.6* 28.2* 27.7*  NA 138 134*  --    K 3.5 4.2  --   CL 106 103  --   CO2 24 25  --   BUN 8 6  --   CREATININE 0.52 0.49  --    Liver Panel Recent Labs    01/27/17 0551 01/28/17 0551  PROT 5.9* 5.9*  ALBUMIN 1.8* 2.0*  AST 43* 36  ALT 57* 46  ALKPHOS 117 114  BILITOT 1.4* 1.2   Sedimentation Rate Recent Labs    01/29/17 0400  ESRSEDRATE 90*   C-Reactive Protein Recent Labs    01/29/17 0400  CRP 10.3*    Microbiology: 1/31, hepatic abscess = Ecoli, and strep viridans group Studies/Results: No results found.   Assessment/Plan: Hepatic abscess = continue with ertapenem for now. Difficult to tell if having rash to carbapenems (since she was previously on meropenem) vs. Residual effect from fentanyl vs. Tramadol. For now would continue with ertapenem. Plan to treat for 3 wk per previous plan  Drug rash = difficult to tell which is the offending agent, if it worsens after next dose of ertapenem, then will devise another regimen  Dr Linus Salmons available over the weekend. Will see her back on Monday if still here.  [facility] Cell: [phone] Pager: [phone]  01/29/2017, 1:39 PM

## 2017-01-30 DIAGNOSIS — D473 Essential (hemorrhagic) thrombocythemia: Secondary | ICD-10-CM | POA: Diagnosis not present

## 2017-01-30 DIAGNOSIS — D649 Anemia, unspecified: Secondary | ICD-10-CM | POA: Diagnosis not present

## 2017-01-30 DIAGNOSIS — D72829 Elevated white blood cell count, unspecified: Secondary | ICD-10-CM | POA: Diagnosis not present

## 2017-01-30 LAB — CBC
HEMATOCRIT: 29.1 % — AB (ref 36.0–46.0)
Hemoglobin: 9.3 g/dL — ABNORMAL LOW (ref 12.0–15.0)
MCH: 28.2 pg (ref 26.0–34.0)
MCHC: 32 g/dL (ref 30.0–36.0)
MCV: 88.2 fL (ref 78.0–100.0)
Platelets: 912 10*3/uL (ref 150–400)
RBC: 3.3 MIL/uL — AB (ref 3.87–5.11)
RDW: 15.5 % (ref 11.5–15.5)
WBC: 11.8 10*3/uL — ABNORMAL HIGH (ref 4.0–10.5)

## 2017-01-30 LAB — AEROBIC/ANAEROBIC CULTURE (SURGICAL/DEEP WOUND)

## 2017-01-30 LAB — AEROBIC/ANAEROBIC CULTURE W GRAM STAIN (SURGICAL/DEEP WOUND): Special Requests: NORMAL

## 2017-01-30 MED ORDER — ERTAPENEM IV (FOR PTA / DISCHARGE USE ONLY): 1.0000 g | INTRAVENOUS | 0 refills | Status: AC

## 2017-01-30 MED ORDER — POLYETHYLENE GLYCOL 3350 17 G PO PACK: 17.0000 g | PACK | Freq: Every day | ORAL | 0 refills | Status: AC

## 2017-01-30 NOTE — Progress Notes (Signed)
Patient ID: Molly Baker, female   DOB: 1970-10-19, 47 y.o.   MRN: [id number]    Referring Physician(s): Dr. Vance Gather  Supervising Physician: Jacqulynn Cadet  Patient Status: [facility] - In-pt  Chief Complaint: Hepatic abscesses  Subjective: Patient ready to go home.  Otherwise no complaints.  Allergies: Ciprofloxacin; Codeine; Dilaudid [hydromorphone hcl]; Morphine and related; Penicillins; Percocet [oxycodone-acetaminophen]; Phenergan [promethazine hcl]; Zosyn [piperacillin-tazobactam in dex]; Flagyl [metronidazole]; Rocephin [ceftriaxone sodium in dextrose]; and Toradol [ketorolac tromethamine]  Medications: Prior to Admission medications   Medication Sig Start Date End Date Taking? Authorizing Provider  buPROPion (WELLBUTRIN XL) 300 MG 24 hr tablet Take 300 mg by mouth daily after breakfast.    Yes [provider]  diphenhydrAMINE (BENADRYL) 25 MG tablet Take 25 mg by mouth every 6 (six) hours as needed for allergies.    Yes [provider]  esomeprazole (NEXIUM) 20 MG capsule Take 40 mg by mouth at bedtime.   Yes [provider]  ibuprofen (ADVIL,MOTRIN) 200 MG tablet Take 200 mg by mouth every 6 (six) hours as needed for headache, mild pain or moderate pain.    Yes [provider]    Vital Signs: BP 125/72 (BP Location: Left Arm)   Pulse 84   Temp 98.1 F (36.7 C) (Oral)   Resp 20   Ht 5\' 4"  (1.626 m)   Wt 200 lb (90.7 kg)   LMP 04/03/2015   SpO2 96%   BMI 34.33 kg/m   Physical Exam: Abd: soft, minimally tender around drains, each drain with minimal slightly milky appearing output.  20cc and 70cc out of both drains yesterday.  Drain sites are c/d/i  Imaging: No results found.  Labs:  CBC: Recent Labs    01/27/17 0551 01/28/17 0551 01/29/17 0400 01/30/17 0519  WBC 12.3* 13.7* 11.4* 11.8*  HGB 9.1* 9.3* 9.0* 9.3*  HCT 26.6* 28.2* 27.7* 29.1*  PLT 644* 446* 813* 912*    COAGS: Recent Labs    01/25/17 0925  INR 1.37     BMP: Recent Labs    01/25/17 0925 01/26/17 0622 01/27/17 0551 01/28/17 0551  NA 140 136 138 134*  K 3.3* 3.0* 3.5 4.2  CL 111 106 106 103  CO2 23 23 24 25   GLUCOSE 92 80 80 85  BUN 15 12 8 6   CALCIUM 7.9* 7.7* 7.7* 7.8*  CREATININE 0.63 0.58 0.52 0.49  GFRNONAA >60 >60 >60 >60  GFRAA >60 >60 >60 >60    LIVER FUNCTION TESTS: Recent Labs    01/23/17 2024 01/26/17 0622 01/27/17 0551 01/28/17 0551  BILITOT 2.6* 1.4* 1.4* 1.2  AST 68* 52* 43* 36  ALT 71* 66* 57* 46  ALKPHOS 119 114 117 114  PROT 7.0 5.9* 5.9* 5.9*  ALBUMIN 2.2* 1.8* 1.8* 2.0*    Assessment and Plan: 1. Hepatic abscess, s/p perc drain x 2  Cont both drains for now.  We will have her follow up in drain clinic in 1 week with a repeat CT scan and drain evaluation.  She will need to have each drain flushed with 5cc of NS daily at home.  I have written a message to care management regarding setting this up once her home health has been arranged by her primary team.    Electronically Signed: Henreitta Cea 01/30/2017, 9:22 AM   I spent a total of 15 Minutes at the the patient's bedside AND on the patient's hospital floor or unit, greater than 50% of which was counseling/coordinating care for  hepatic abscess

## 2017-01-30 NOTE — Progress Notes (Signed)
    [facility] for Infectious Disease   Reason for visit: Follow up on hepatic abscess, rash  Interval History: rash improved, still some itching but; afebrile, WBC 11.8  Physical Exam: Constitutional:  Vitals:   01/29/17 2035 01/30/17 0505  BP: (!) 112/54 125/72  Pulse: 97 84  Resp: 18 20  Temp: 97.8 F (36.6 C) 98.1 F (36.7 C)  SpO2: 95% 96%   patient appears in NAD Respiratory: Normal respiratory effort; CTA B Cardiovascular: RRR Skin: maculopapular rash  Review of Systems: Constitutional: negative for fevers and chills Gastrointestinal: negative for diarrhea  Lab Results  Component Value Date   WBC 11.8 (H) 01/30/2017   HGB 9.3 (L) 01/30/2017   HCT 29.1 (L) 01/30/2017   MCV 88.2 01/30/2017   PLT 912 (HH) 01/30/2017    Lab Results  Component Value Date   CREATININE 0.49 01/28/2017   BUN 6 01/28/2017   NA 134 (L) 01/28/2017   K 4.2 01/28/2017   CL 103 01/28/2017   CO2 25 01/28/2017    Lab Results  Component Value Date   ALT 46 01/28/2017   AST 36 01/28/2017   ALKPHOS 114 01/28/2017     Microbiology: Recent Results (from the past 240 hour(s))  Aerobic/Anaerobic Culture (surgical/deep wound)     Status: None (Preliminary result)   Collection Time: 01/25/17  1:50 PM  Result Value Ref Range Status   Specimen Description ABSCESS LIVER  Final   Special Requests Normal  Final   Gram Stain   Final    ABUNDANT WBC PRESENT,BOTH PMN AND MONONUCLEAR MODERATE GRAM POSITIVE COCCI IN PAIRS IN CHAINS RARE GRAM VARIABLE ROD Performed at [facility], [address], [place], [place] [address]    Culture   Final    MODERATE ESCHERICHIA COLI MODERATE VIRIDANS STREPTOCOCCUS NO ANAEROBES ISOLATED; CULTURE IN PROGRESS FOR 5 DAYS    Report Status PENDING  Incomplete   Organism ID, Bacteria ESCHERICHIA COLI  Final      Susceptibility   Escherichia coli - MIC*    AMPICILLIN 4 SENSITIVE Sensitive     CEFAZOLIN <=4 SENSITIVE Sensitive     CEFEPIME <=1  SENSITIVE Sensitive     CEFTAZIDIME <=1 SENSITIVE Sensitive     CEFTRIAXONE <=1 SENSITIVE Sensitive     CIPROFLOXACIN <=0.25 SENSITIVE Sensitive     GENTAMICIN <=1 SENSITIVE Sensitive     IMIPENEM <=0.25 SENSITIVE Sensitive     TRIMETH/SULFA <=20 SENSITIVE Sensitive     AMPICILLIN/SULBACTAM <=2 SENSITIVE Sensitive     PIP/TAZO <=4 SENSITIVE Sensitive     Extended ESBL NEGATIVE Sensitive     * MODERATE ESCHERICHIA COLI    Impression/Plan:  1. Hepatic abscess - tolerating ertapenem and plan in place per Dr Baxter Flattery through 1/21. 2. Rash - improved so will continue ertapenem as above.

## 2017-01-30 NOTE — Progress Notes (Signed)
CRITICAL VALUE ALERT  Critical Value:  Platelets 912 Date & Time Notied:  01/30/2017 0611  Provider Notified:[place]  Orders Received/Actions taken: none

## 2017-01-30 NOTE — Progress Notes (Signed)
Patient is getting ready for discharge and will go home with PICC Line in place for IV anbx to be starting with Home Health tomorrow. Nurse checked with pharmacist and the earliest Molly Baker can be administered IV safely is 6pm. Pt will be discharged after receiving this medication.

## 2017-01-30 NOTE — Care Management Note (Signed)
Case Management Note  Patient Details  Name: Elianie Hubers MRN: [id number] Date of Birth: Apr 13, 1970  Subjective/Objective:     Hepatic abscess                Action/Plan: Discharge Planning: Scheduled to dc home with IV abx qd with [facility]. Contacted [facility] to make aware of dc home today, need a start of care for 01/31/2017. [facility] confirmed receipt of referral for soc 01/31/2017.   PCP Hulan Fess MD  Expected Discharge Date:  01/30/17               Expected Discharge Plan:  [place]  In-House Referral:  NA  Discharge planning Services  CM Consult  Post Acute Care Choice:  Home Health Choice offered to:  Patient  DME Arranged:  N/A DME Agency:  NA  HH Arranged:  RN, IV Antibiotics HH Agency:  [facility]  Status of Service:  Completed, signed off  If discussed at [place] of Stay Meetings, dates discussed:    Additional Comments:  Erenest Rasher, RN 01/30/2017, 2:38 PM

## 2017-01-30 NOTE — Progress Notes (Signed)
Went over Discharge instructions with patient and husband, script given question answered, IV team discounted picc line.  Patient verbalized understanding of discharge information.  Patient  DC home with hudaband via wheelchair.

## 2017-01-30 NOTE — Plan of Care (Signed)
  Safety: Ability to remain free from injury will improve 01/30/2017 0224 - Adequate for Discharge by Mickie Kay, RN

## 2017-01-31 ENCOUNTER — Encounter (HOSPITAL_COMMUNITY): Payer: Self-pay | Admitting: Nurse Practitioner

## 2017-01-31 ENCOUNTER — Emergency Department (HOSPITAL_COMMUNITY)
Admission: EM | Admit: 2017-01-31 | Discharge: 2017-01-31 | Disposition: A | Discharge status: Home / Self Care | Payer: BLUE CROSS/BLUE SHIELD | Attending: Emergency Medicine | Admitting: Emergency Medicine

## 2017-01-31 ENCOUNTER — Emergency Department (HOSPITAL_COMMUNITY): Payer: BLUE CROSS/BLUE SHIELD

## 2017-01-31 DIAGNOSIS — R21 Rash and other nonspecific skin eruption: Secondary | ICD-10-CM | POA: Insufficient documentation

## 2017-01-31 DIAGNOSIS — K651 Peritoneal abscess: Secondary | ICD-10-CM

## 2017-01-31 DIAGNOSIS — Z79899 Other long term (current) drug therapy: Secondary | ICD-10-CM | POA: Diagnosis not present

## 2017-01-31 DIAGNOSIS — R230 Cyanosis: Secondary | ICD-10-CM

## 2017-01-31 DIAGNOSIS — K750 Abscess of liver: Secondary | ICD-10-CM | POA: Diagnosis not present

## 2017-01-31 DIAGNOSIS — R079 Chest pain, unspecified: Secondary | ICD-10-CM | POA: Diagnosis not present

## 2017-01-31 LAB — I-STAT BETA HCG BLOOD, ED (MC, WL, AP ONLY)

## 2017-01-31 LAB — CBC
HEMATOCRIT: 32.8 % — AB (ref 36.0–46.0)
HEMOGLOBIN: 10.7 g/dL — AB (ref 12.0–15.0)
MCH: 28.8 pg (ref 26.0–34.0)
MCHC: 32.6 g/dL (ref 30.0–36.0)
MCV: 88.4 fL (ref 78.0–100.0)
Platelets: 916 10*3/uL (ref 150–400)
RBC: 3.71 MIL/uL — AB (ref 3.87–5.11)
RDW: 15.4 % (ref 11.5–15.5)
WBC: 9.9 10*3/uL (ref 4.0–10.5)

## 2017-01-31 LAB — BASIC METABOLIC PANEL
ANION GAP: 7 (ref 5–15)
BUN: 10 mg/dL (ref 6–20)
CALCIUM: 8.4 mg/dL — AB (ref 8.9–10.3)
CO2: 27 mmol/L (ref 22–32)
Chloride: 103 mmol/L (ref 101–111)
Creatinine, Ser: 0.66 mg/dL (ref 0.44–1.00)
GLUCOSE: 83 mg/dL (ref 65–99)
POTASSIUM: 3.7 mmol/L (ref 3.5–5.1)
SODIUM: 137 mmol/L (ref 135–145)

## 2017-01-31 LAB — I-STAT TROPONIN, ED: TROPONIN I, POC: 0 ng/mL (ref 0.00–0.08)

## 2017-01-31 MED ORDER — DIPHENHYDRAMINE HCL 25 MG PO CAPS
25.0000 mg | ORAL_CAPSULE | Freq: Once | ORAL | Status: AC
  Administered 2017-01-31: 25 mg via ORAL

## 2017-01-31 MED ORDER — FENTANYL CITRATE (PF) 100 MCG/2ML IJ SOLN
50.0000 ug | Freq: Once | INTRAMUSCULAR | Status: AC
  Administered 2017-01-31: 50 ug via INTRAVENOUS
  Filled 2017-01-31: qty 2

## 2017-01-31 MED ORDER — DIPHENHYDRAMINE HCL 25 MG PO CAPS
25.0000 mg | ORAL_CAPSULE | Freq: Once | ORAL | Status: AC
  Administered 2017-01-31: 25 mg via ORAL
  Filled 2017-01-31: qty 1

## 2017-01-31 NOTE — ED Provider Notes (Signed)
  Face-to-face evaluation   History: She is here for discolored hands, which she noticed today.  She contacted her PCP and was instructed to come here for evaluation.  She is being treated with antibiotics, for an intra-abdominal abscess.  She has pain in her right upper abdomen which limits deep inspiration.  She has abdominal pain and is not taking any medication because all she can take is "fentanyl."  Physical exam: Obese, alert, cooperative.  Abdomen mild right-sided upper tenderness.  Heart regular rate and rhythm without murmur lungs clear to auscultation but decreased air movement right base.  Hands, bilaterally with normal warmth, and capillary refill in all fingers.  There is a vague bluish discoloration primarily on the dorsum of the hands with a clear straight-line demarcation on the right hand as depicted in the image below.  Other areas of the hands are less definitively discolored.  All fingers, seem to have the bluish discoloration, primarily dorsally.  There is no deformity of any of the fingers or the hands.  There is no discoloration above the wrists.         Medical decision making-resolving intra-abdominal abscess.  Skin discoloration is nonspecific.  It appears very superficial and does not seem to involve deep tissues in any manner.  Skin discoloration does not appear to affect the vascular, nervous or musculoskeletal systems.  There is no indication for further evaluation or treatment in the ED setting, at this time.  The patient is instructed to follow-up with her PCP regarding her overall health, and the skin discoloration.  She may benefit from referral to dermatology for definitive diagnosis.  Medical screening examination/treatment/procedure(s) were conducted as a shared visit with non-physician practitioner(s) and myself.  I personally evaluated the patient during the encounter    Daleen Bo, MD 02/02/17 1233

## 2017-01-31 NOTE — ED Triage Notes (Signed)
Pt presents with blue discoloration of bilateral upper extremities/hands, none on her lips or lower extremities, denies shortness of breath or hx of Raynaud's syndrome, but of note she had an invasive procedure done on her liver. Denies being on anticoagulant or experiencing chest pain, was advised to come in to rule out PE.

## 2017-01-31 NOTE — ED Provider Notes (Signed)
[facility] DEPT Provider Note   CSN: [id number] Arrival date & time: 01/31/17  1723     History   Chief Complaint Chief Complaint  Patient presents with  . Cyanosis    Bilateral hands    HPI Molly Baker is a 47 y.o. female.  HPI   Patient is a 47 y.o. female with a recent history of hepatic abscess requiring percutaneous drainage and drain placement by interventional radiology presenting for bluish discoloration of bilateral hands.  Patient was discharged from the hospital yesterday.  Due to multiple allergies, patient had close involvement of infectious disease during her admission.  Patient was started on ertapenem via a PICC line 2 days ago.  Patient noted that she had bluish discoloration to bilateral hand starting at 11 AM today.  This was not associated with numbness, tingling, weakness of her bilateral upper extremities.  This was not associated with pallor.  Patient denies cold feeling to her hands.  No involvement of toes. Patient denies oral or mucosal pallor.  Patient denies shortness of breath, chest pain, or palpitations.  No fever or chills.  Patient has had an erythematous rash ever since multiple trials of antibiotics, however this is remained unchanged.  Patient was evaluated by her home care nurse who paged on-call physician regarding discoloration.  It was recommended she come to the emergency department for further evaluation.  Patient has never experienced this phenomenon before, and has no history of autoimmune disease or Raynaud's phenomena.  Patient was not under general anesthesia for her drain placement.  No history of DVT/PE.  Patient had SCDs during her hospitalization, but was walking daily and regularly and they were discontinued.  History reviewed. No pertinent past medical history.  Patient Active Problem List   Diagnosis Date Noted  . 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 History of common bile duct surgery 01/24/2017    . Hepatic abscess 01/23/2017    Past Surgical History:  Procedure Laterality Date  . ABDOMINAL HYSTERECTOMY     partial  . CHOLECYSTECTOMY    . common bile duct reconstruction      OB History    No data available       Home Medications    Prior to Admission medications   Medication Sig Start Date End Date Taking? Authorizing Provider  buPROPion (WELLBUTRIN XL) 300 MG 24 hr tablet Take 300 mg by mouth daily after breakfast.    Yes [provider]  esomeprazole (NEXIUM) 20 MG capsule Take 40 mg by mouth at bedtime.   Yes [provider]  ertapenem (INVANZ) IVPB Inject 1 g into the vein daily. Indication:  Hepatic abscess Last Day of Therapy:  02/15/17 Labs - Once weekly:  CBC/D and BMP, Labs - Every other week:  ESR and CRP 01/30/17   Hosie Poisson, MD  polyethylene glycol (MIRALAX / GLYCOLAX) packet Take 17 g by mouth daily. 01/31/17   Hosie Poisson, MD    Family History Family History  Problem Relation Age of Onset  . Breast cancer Maternal Grandmother     Social History Social History   Tobacco Use  . Smoking status: Never Smoker  . Smokeless tobacco: Never Used  Substance Use Topics  . Alcohol use: Yes    Comment: occassionally  . Drug use: No     Allergies   Ciprofloxacin; Codeine; Dilaudid [hydromorphone hcl]; Morphine and related; Penicillins; Percocet [oxycodone-acetaminophen]; Phenergan [promethazine hcl]; Zosyn [piperacillin-tazobactam in dex]; Flagyl [metronidazole]; Rocephin [ceftriaxone sodium in dextrose]; and Toradol [  ketorolac tromethamine]   Review of Systems Review of Systems  Constitutional: Negative for chills and fever.  HENT: Negative for mouth sores and sore throat.   Eyes: Negative for visual disturbance.  Respiratory: Negative for cough, chest tightness and shortness of breath.   Cardiovascular: Negative for chest pain, palpitations and leg swelling.  Gastrointestinal: Positive for abdominal pain. Negative for diarrhea,  nausea and vomiting.       +surgical site pain.  Musculoskeletal: Negative for myalgias.  Skin: Positive for color change and rash.  Allergic/Immunologic: Negative for immunocompromised state.  Neurological: Negative for dizziness, syncope, light-headedness and headaches.  All other systems reviewed and are negative.    Physical Exam Updated Vital Signs BP 109/62   Pulse 89   Temp 98.7 F (37.1 C) (Oral)   Resp 17   LMP 04/03/2015   SpO2 94%   Physical Exam  Constitutional: She appears well-developed and well-nourished. No distress.  HENT:  Head: Normocephalic and atraumatic.  Mouth/Throat: Oropharynx is clear and moist.  No mucosal pallor. No perioral cyanosis.  Eyes: Conjunctivae and EOM are normal. Pupils are equal, round, and reactive to light.  Neck: Normal range of motion. Neck supple.  Cardiovascular: Normal rate, regular rhythm, S1 normal and S2 normal.  No murmur heard. No lower extremity edema.  No calf tenderness.  Pulmonary/Chest: Effort normal and breath sounds normal. She has no wheezes. She has no rales.  Abdominal: Soft. She exhibits no distension. There is tenderness. There is no guarding.  Biliary drain intact and surgical site without erythema.  Active serosanguineous drainage. Patient has some mild right upper quadrant tenderness.  Abdomen is benign and nonsurgical.  Musculoskeletal: Normal range of motion. She exhibits no edema or deformity.  Neurological: She is alert.  Cranial nerves grossly intact. Patient moves extremities symmetrically and with good coordination.  Skin: Skin is warm and dry.  Bilateral dorsums of wrist with bluish discoloration.  Dorsums of wrists with greater discoloration than palmar skin. Skin is blanching over areas of discoloration.  Bluish discoloration also affects nails. There is no discoloration streaking up bilateral arms.  Bilateral hands are warm and well perfused.  Capillary refill is less than 2 seconds.  Psychiatric:  She has a normal mood and affect. Her behavior is normal. Judgment and thought content normal.  Nursing note and vitals reviewed.      ED Treatments / Results  Labs (all labs ordered are listed, but only abnormal results are displayed) Labs Reviewed  BASIC METABOLIC PANEL - Abnormal; Notable for the following components:      Result Value   Calcium 8.4 (*)    All other components within normal limits  CBC - Abnormal; Notable for the following components:   RBC 3.71 (*)    Hemoglobin 10.7 (*)    HCT 32.8 (*)    Platelets 916 (*)    All other components within normal limits  I-STAT TROPONIN, ED  I-STAT BETA HCG BLOOD, ED ([facility], [facility], [facility] ONLY)    EKG  EKG Interpretation  Date/Time:  Sunday January 31 2017 19:59:29 EST Ventricular Rate:  93 PR Interval:    QRS Duration: 87 QT Interval:  365 QTC Calculation: 454 R Axis:   62 Text Interpretation:  Sinus rhythm Inferior infarct, old No old tracing to compare Confirmed by Daleen Bo [phone]) on 01/31/2017 8:09:31 PM       Radiology Dg Chest 2 View  Result Date: 01/31/2017 CLINICAL DATA:  Chest pain EXAM: CHEST  2 VIEW COMPARISON:  None. FINDINGS: Two views study shows low volumes with mild asymmetric elevation of the right hemidiaphragm. There is a small right pleural effusion with right base atelectasis. Left lung clear. Heart size normal. Right PICC line tip overlies the distal SVC. Pigtail drainage catheters overly the right upper quadrant in this patient with known hepatic abscess. IMPRESSION: Low volume film with right base atelectasis and small right pleural effusion. Electronically Signed   By: Misty Stanley M.D.   On: 01/31/2017 19:27    Procedures Procedures (including critical care time)  Medications Ordered in ED Medications  diphenhydrAMINE (BENADRYL) capsule 25 mg (25 mg Oral Given 01/31/17 2122)  fentaNYL (SUBLIMAZE) injection 50 mcg (50 mcg Intravenous Given 01/31/17 2123)  diphenhydrAMINE (BENADRYL) capsule 25 mg  (25 mg Oral Given by Other 01/31/17 2346)     Initial Impression / Assessment and Plan / ED Course  I have reviewed the triage vital signs and the nursing notes.  Pertinent labs & imaging results that were available during my care of the patient were reviewed by me and considered in my medical decision making (see chart for details).     Final Clinical Impressions(s) / ED Diagnoses   Final diagnoses:  Intra-abdominal abscess ([place])  Bluish skin discoloration   Chest x-ray demonstrates atelectasis versus pleural effusion within the right lower lung.  This is likely due to lack of deep inspiration due to the drain placement. Thrombocytosis determined by hospital medicine to be reactive, likely secondary to abscess.  Patient without any signs of sludging.  This was discussed with Dr. Daleen Bo.  Thrombocytosis is stable from discharge yesterday.  Patient to follow-up with her primary care provider regarding repeat evaluation for resolution of this thrombocytosis.  Hemoglobin is improving from the time of discharge from the hospital.  Spoke with pharmacist Corene Cornea who evaluated side effects of ertapenem.  There are no known side effects causing bluish discoloration of the hand.  The only rare dermatologic finding is DRESS syndrome (Drug Rash with Eosinophilia and Systemic Symptoms) which does not correlate with the patient's symptoms.    The sites of discoloration are well demarcated and likely a benign etiology, unlikely vascular.  Hands are well perfused and there is no evidence of pulmonary abnormality that is causing poor perfusion. Well's score negative and PERC negative.   Patient following up with Dr. Pascal Lux of Interventional Radiology tomorrow via phone call. Patient to follow up with PCP for thrombocytosis recheck.   Patient discharged per Dr. Eulis Foster after his independent evaluation.   ED Discharge Orders    None       Tamala Julian 02/01/17 0239    Daleen Bo,  MD 02/01/17 [phone]

## 2017-01-31 NOTE — Discharge Instructions (Addendum)
There were no abnormal findings associated with the abnormal skin color of your hands.  Continue taking her current medicines and treatments.  Call your primary care doctor for an appointment, regarding your overall health, and to get his opinion about the skin discoloration.  There is not appear to be any complications with muscles, nerves, veins, arteries or bones.  Your primary care doctor may recommend that you see a dermatologist for further evaluation.

## 2017-01-31 NOTE — ED Notes (Signed)
PA at bedside.

## 2017-01-31 NOTE — ED Notes (Signed)
Patient request lab draw from PICC line.

## 2017-01-31 NOTE — Progress Notes (Signed)
[facility]  Molly Baker is an active pt [facility] as of her DC home just yesterday for home IV ABX.  [facility] hospital team will follow while at hospital to support DC home when ordered to continue her home care services.  If patient discharges after hours, please call [phone].   Larry Sierras 01/31/2017, 8:55 PM

## 2017-02-01 ENCOUNTER — Other Ambulatory Visit: Payer: Self-pay | Admitting: Internal Medicine

## 2017-02-01 DIAGNOSIS — K750 Abscess of liver: Secondary | ICD-10-CM

## 2017-02-01 NOTE — Discharge Summary (Addendum)
Physician Discharge Summary  Molly Baker DGL:[id number] DOB: 13-Feb-1970 DOA: 01/23/2017  PCP: Hulan Fess, MD  Admit date: 01/23/2017 Discharge date: 01/30/2017  Admitted From: Home.  Disposition:  Home with Home health.   Recommendations for Outpatient Follow-up:  1. Follow up with PCP in 1-2 weeks 2. Please obtain BMP/CBC in one week 3. Please follow up with PCP regarding the thrombocytosis.  4. Please follow up with MRI of the abdomen with and without contrast in 3 months.   Home Health:yes   Discharge Condition:stable.   CODE STATUS:full code.  Diet recommendation: Heart Healthy   Brief/Interim Summary:   Discharge Diagnoses:  Principal Problem:   Hepatic abscess Active Problems:   History of common bile duct surgery   Biliary anastomotic stenosis    Complex Hepatic abscess s/p CT guided drains placement.  H/o previous abdominal surgery in the past at [facility] 20 years ago?.  Purulent abscess growing Ecoli and viridans streptococci.  IR following the JP output.  Pain control with tylenol.  Discharged on invanz for daily dosing convenience for 18 days to complete the course. Please follow up with GI and ID as recommended.  PICCline placed and PICC line care as per [facility] .  Please remove the pICC line after antibiotics are completed.  Please follow up with IR regarding the JP drains.   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  Bilirubin stable.  Gi consulted and awaiting records.  Recommend outpatient follow upw ith Dr Oletta Lamas as scheduled.    Pericardiophrenic and porta hepatis adenopathy:  Reactive.  Follow up with MRI of the abdomen with and without contrast in 3 months   Maculopapular erythematous rash:  Suspect from the allergic reaction to pcn's, pain meds.  No evidence of airway compromise.  Two dose of solumedrol given with  benadryl.   Depression:  Stable.   GERD:  Resume PPI.   Leukocytosis: probably from the abscess.   Anemia of chronic disease:  Hemoglobin stable around 9.    Thrombocytosis: Reactive  Probably from abscess.  recommend follow up with PCP and check cbc in one week.   Hypokalemia replaced.   Mild hyponatremia.  Monitor prn.     Discharge Instructions  Discharge Instructions    Diet general   Complete by:  As directed    Discharge instructions   Complete by:  As directed    FOLLOW up with gastroenterology as recommended.  Please follow up with IR as scheduled.   Home infusion instructions [facility] May follow [place] Dosing Protocol; May administer Cathflo as needed to maintain patency of vascular access device.; Flushing of vascular access device: per [facility] Protocol: 0.9% NaCl pre/post medica...   Complete by:  As directed    Instructions:  May follow [place] Dosing Protocol   Instructions:  May administer Cathflo as needed to maintain patency of vascular access device.   Instructions:  Flushing of vascular access device: per [facility] Protocol: 0.9% NaCl pre/post medication administration and prn patency; Heparin 100 u/ml, 64m for implanted ports and Heparin 10u/ml, 534mfor all other central venous catheters.   Instructions:  May follow [facility] Anaphylaxis Protocol for First Dose Administration in the home: 0.9% NaCl at 25-50 ml/hr to maintain IV access for protocol meds. Epinephrine 0.3 ml IV/IM PRN and Benadryl 25-50 IV/IM PRN s/s of anaphylaxis.   Instructions:  [facility] Infusion  Coordinator (RN) to assist per patient IV care needs in the home PRN.     Allergies as of 01/30/2017      Reactions   Ciprofloxacin Anaphylaxis   Codeine Anaphylaxis   Dilaudid [hydromorphone Hcl] Anaphylaxis   Morphine And Related Anaphylaxis   Penicillins Anaphylaxis   Has patient had a PCN reaction causing immediate rash, facial/tongue/throat swelling, SOB or  lightheadedness with hypotension: yes Has patient had a PCN reaction causing severe rash involving mucus membranes or skin necrosis: no Has patient had a PCN reaction that required hospitalization: yes Has patient had a PCN reaction occurring within the last 10 years: no If all of the above answers are "NO", then may proceed with Cephalosporin use.   Percocet [oxycodone-acetaminophen] Anaphylaxis   Phenergan [promethazine Hcl] Anaphylaxis   Zosyn [piperacillin-tazobactam In Dex] Anaphylaxis   Flagyl [metronidazole] Other (See Comments)   Flushing   Rocephin [ceftriaxone Sodium In Dextrose] Other (See Comments)   Flushing   Toradol [ketorolac Tromethamine]    RASH      Medication List    STOP taking these medications   ibuprofen 200 MG tablet Commonly known as:  ADVIL,MOTRIN     TAKE these medications   buPROPion 300 MG 24 hr tablet Commonly known as:  WELLBUTRIN XL Take 300 mg by mouth daily after breakfast.   ertapenem IVPB Commonly known as:  INVANZ Inject 1 g into the vein daily. Indication:  Hepatic abscess Last Day of Therapy:  02/15/17 Labs - Once weekly:  CBC/D and BMP, Labs - Every other week:  ESR and CRP   esomeprazole 20 MG capsule Commonly known as:  NEXIUM Take 40 mg by mouth at bedtime.   polyethylene glycol packet Commonly known as:  MIRALAX / GLYCOLAX Take 17 g by mouth daily.            Home Infusion Instuctions  (From admission, onward)        Start     Ordered   01/30/17 0000  Home infusion instructions [facility] May follow [place] Dosing Protocol; May administer Cathflo as needed to maintain patency of vascular access device.; Flushing of vascular access device: per [facility] Protocol: 0.9% NaCl pre/post medica...    Question Answer Comment  Instructions May follow [place] Dosing Protocol   Instructions May administer Cathflo as needed to maintain patency of vascular access device.   Instructions Flushing of vascular access  device: per [facility] Protocol: 0.9% NaCl pre/post medication administration and prn patency; Heparin 100 u/ml, 29m for implanted ports and Heparin 10u/ml, 558mfor all other central venous catheters.   Instructions May follow [facility] Anaphylaxis Protocol for First Dose Administration in the home: 0.9% NaCl at 25-50 ml/hr to maintain IV access for protocol meds. Epinephrine 0.3 ml IV/IM PRN and Benadryl 25-50 IV/IM PRN s/s of anaphylaxis.   Instructions [facility] Infusion Coordinator (RN) to assist per patient IV care needs in the home PRN.      01/30/17 1254     Follow-up Information    Little, KeLennette BihariMD. Schedule an appointment as soon as possible for a visit in 1 week(s).   Specialty:  Family Medicine Contact information: [phone]        EdLaurence SpatesMD. Schedule an appointment as soon as possible for a visit in 1 week(s).   Specialty:  Gastroenterology Contact information: [id number]. [phone]  Health, [facility]-Home Follow up.   Specialty:  [place] Why:  Home Health RN- agency will call to arrange initial visit Contact information: [place] [address] [phone]          Allergies  Allergen Reactions  . Ciprofloxacin Anaphylaxis  . Codeine Anaphylaxis  . Dilaudid [Hydromorphone Hcl] Anaphylaxis  . Morphine And Related Anaphylaxis  . Penicillins Anaphylaxis    Has patient had a PCN reaction causing immediate rash, facial/tongue/throat swelling, SOB or lightheadedness with hypotension: yes Has patient had a PCN reaction causing severe rash involving mucus membranes or skin necrosis: no Has patient had a PCN reaction that required hospitalization: yes Has patient had a PCN reaction occurring within the last 10 years: no If all of the above answers are "NO", then may proceed with Cephalosporin use.   Marland Kitchen Percocet  [Oxycodone-Acetaminophen] Anaphylaxis  . Phenergan [Promethazine Hcl] Anaphylaxis  . Zosyn [Piperacillin-Tazobactam In Dex] Anaphylaxis  . Flagyl [Metronidazole] Other (See Comments)    Flushing  . Rocephin [Ceftriaxone Sodium In Dextrose] Other (See Comments)    Flushing  . Toradol [Ketorolac Tromethamine]     RASH    Consultations:  Gastroenterology  IR  ID     Procedures/Studies:  PICC LINE  CT GUIDED JP DRAIN  Dg Chest 2 View  Result Date: 01/31/2017 CLINICAL DATA:  Chest pain EXAM: CHEST  2 VIEW COMPARISON:  None. FINDINGS: Two views study shows low volumes with mild asymmetric elevation of the right hemidiaphragm. There is a small right pleural effusion with right base atelectasis. Left lung clear. Heart size normal. Right PICC line tip overlies the distal SVC. Pigtail drainage catheters overly the right upper quadrant in this patient with known hepatic abscess. IMPRESSION: Low volume film with right base atelectasis and small right pleural effusion. Electronically Signed   By: Misty Stanley M.D.   On: 01/31/2017 19:27   Ct Abdomen Pelvis W Contrast  Result Date: 01/23/2017 CLINICAL DATA:  47 year old female with epigastric pain and fever. Prior cholecystectomy. EXAM: CT ABDOMEN AND PELVIS WITH CONTRAST TECHNIQUE: Multidetector CT imaging of the abdomen and pelvis was performed using the standard protocol following bolus administration of intravenous contrast. CONTRAST:  127m ISOVUE-300 IOPAMIDOL (ISOVUE-300) INJECTION 61% COMPARISON:  None. FINDINGS: Lower chest: There is mild eventration of the right hemidiaphragm with right lung base atelectatic changes. The left lung base is clear. No intra-abdominal free air or free fluid. Hepatobiliary: There is diffuse fatty infiltration of the liver. The liver is enlarged measuring 18 cm in midclavicular length. There is an 11 x 11 cm complex mass with cystic or necrotic areas in the right lobe of the liver with mild surrounding  edema. There is associated mass effect and displacement of the adjacent liver parenchyma and vasculature. This lesion is not well characterized but may represent a neoplasm or an infectious process/abscess. Correlation with clinical exam and further evaluation with dedicated MRI without and with contrast recommended. There is postsurgical changes of cholecystectomy. There is pneumobilia. Pancreas: Unremarkable. No pancreatic ductal dilatation or surrounding inflammatory changes. Spleen: Normal in size without focal abnormality. Adrenals/Urinary Tract: The adrenal glands are unremarkable. Amorphous calcification of the inferior pole of the right kidney measuring 10 mm in diameter. There is no hydronephrosis on either side. The visualized ureters and urinary bladder appear unremarkable. Stomach/Bowel: No bowel obstruction or active inflammation. The appendix is not visualized with certainty. No inflammatory changes identified in the right lower quadrant. Vascular/Lymphatic: No significant vascular findings are  present. No enlarged abdominal or pelvic lymph nodes. Reproductive: Hysterectomy. No pelvic mass. The ovaries are unremarkable. Other: None Musculoskeletal: No acute or significant osseous findings. IMPRESSION: 1. Large complex on mass in the right lobe of the liver with mild surrounding edema. This may represent a malignancy or an infectious process/abscess. Further evaluation with MRI recommended. 2. Hepatomegaly with fatty infiltration of the liver. 3. Cholecystectomy with pneumobilia. 4. No bowel obstruction or active inflammation. 5. Focus of amorphous calcific density in the inferior pole of the right kidney. Electronically Signed   By: Anner Crete M.D.   On: 01/23/2017 22:28   Mr Liver W Wo Contrast  Result Date: 01/25/2017 CLINICAL DATA:  47 year old female inpatient admitted with epigastric abdominal pain, nausea and fever with right liver dome mass on CT. Remote cholecystectomy, reportedly  complicated by bile duct injury. EXAM: MRI ABDOMEN WITHOUT AND WITH CONTRAST TECHNIQUE: Multiplanar multisequence MR imaging of the abdomen was performed both before and after the administration of intravenous contrast. CONTRAST:  20 cc MultiHance IV. COMPARISON:  01/23/2017 CT abdomen/ pelvis. FINDINGS: Lower chest: Elevation of the right hemidiaphragm. Mild-to-moderate right basilar atelectasis. A few mildly enlarged right pericardiophrenic nodes measuring up to 1.0 cm (series 1003/ image 28). Hepatobiliary: Mild hepatomegaly. Moderate to severe diffuse hepatic steatosis. There is a 12.3 x 8.9 x 10.6 cm right superior liver mass with lobular contour centered within segment 8 of the right liver lobe, with extension into liver segments 4A and 7, which demonstrates thick enhancing wall, numerous thick enhancing internal septations and prominent restricted diffusion, compatible with an hepatic abscess. A few additional scattered subcentimeter foci of T2 hyperintensity are noted in the anterior right liver lobe, which demonstrate no appreciable enhancement. Status post cholecystectomy. There is prominent intrahepatic biliary ductal dilatation isolated to segments 5, 7 and 8 in the right liver lobe, with sharp tapering of the dilated intrahepatic right liver lobe bile ducts centrally near the porta hepatis, without an appreciable obstructing mass or stone in this location. There is evidence of a biliary enteric anastomosis associated with the biliary stricture site. Scattered low signal intensity in the nondependent portion of the dilated intrahepatic right liver lobe bile ducts is compatible with pneumobilia as seen on the CT study. The left liver lobe and inferior right liver lobe intrahepatic bile ducts are normal caliber. Common bile duct diameter 8 mm. No evidence of choledocholithiasis. Pancreas: No pancreatic mass or duct dilation.  No pancreas divisum. Spleen: Normal size. No mass. Adrenals/Urinary Tract: Normal  adrenals. Normal size kidneys. No hydronephrosis. There is a T2 hypointense 0.9 cm renal cortical focus in the inferior right kidney (series 7/ image 56) without enhancement on the subtraction sequences, correlating with a focus of calcification on the recent CT study, most compatible with scarring or a calcified hemorrhagic cyst. Subcentimeter simple lower left renal cyst. No suspicious renal masses. Stomach/Bowel: Grossly normal stomach. Visualized small and large bowel is normal caliber, with no bowel wall thickening. Vascular/Lymphatic: Normal caliber abdominal aorta. Patent portal, splenic, hepatic and renal veins. Mild porta hepatis adenopathy measuring up to the 1.6 cm (series 1005/ image 61). Other: No ascites.  No additional fluid collections. Musculoskeletal: No aggressive appearing focal osseous lesions. IMPRESSION: 1. Large 12.3 cm right superior hepatic abscess, multilocular. Additional subcentimeter scattered T2 hyperintense nonenhancing right liver foci, probably additional tiny satellite abscesses or cysts. 2. Evidence of a high-grade stricture at the site of a biliary-enteric anastomosis draining segments 5, 7 and 8 of the right liver lobe. No evidence  of obstructing mass or stone. 3. Normal caliber CBD (8 mm diameter) status post cholecystectomy. Left liver lobe and inferior right liver lobe bile ducts are normal caliber. 4. Nonspecific pericardiophrenic and porta hepatis adenopathy, probably reactive. Recommend post treatment MRI abdomen without and with IV contrast in 3 months . 5. Elevation of the right hemidiaphragm with right basilar atelectasis . 6. Moderate to severe diffuse hepatic steatosis. These results were called by telephone at the time of interpretation on 01/25/2017 at 12:20 pm to Dr. Ronny Bacon, who verbally acknowledged these results. Electronically Signed   By: Ilona Sorrel M.D.   On: 01/25/2017 12:22   Ct Image Guided Drainage Percut Cath  Peritoneal Retroperit  Result Date:  01/25/2017 INDICATION: Remote history of cholecystectomy complicated by a biliary injury requiring creation of a hepaticojejunostomy complicated by a recurrent biliary stricture requiring prolonged biliary catheter placement with multiple subsequent biliary angioplasties (per pt report), all of which was performed at outside institutions. Patient now presents with right upper quadrant abdominal pain, fever and chills with cross-sectional imaging concerning for development of a large multi-locular abscess within the right lobe of the liver. Request made for percutaneous drainage catheter placement for infection source control purposes. EXAM: ULTRASOUND AND CT-GUIDED HEPATIC ABSCESS DRAINAGE CATHETER PLACEMENT X2 COMPARISON:  CT abdomen pelvis - 01/23/2017; abdominal MRI - 01/25/2017 MEDICATIONS: The patient is currently admitted to the hospital and receiving intravenous antibiotics. The antibiotics were administered within an appropriate time frame prior to the initiation of the procedure. ANESTHESIA/SEDATION: Moderate (conscious) sedation was employed during this procedure. A total of Versed 4 mg and Fentanyl 250 mcg was administered intravenously. Moderate Sedation Time: 36 minutes. The patient's level of consciousness and vital signs were monitored continuously by radiology nursing throughout the procedure under my direct supervision. CONTRAST:  None COMPLICATIONS: None immediate. PROCEDURE: Informed written consent was obtained from the patient after a discussion of the risks, benefits and alternatives to treatment. The patient was placed supine on the CT gantry and a pre procedural CT was performed re-demonstrating the known abscess/fluid collection within the right lobe of the liver with dominant ill-defined component measuring at least 10.0 x 8.6 cm (image 33, series 2). The procedure was planned. A timeout was performed prior to the initiation of the procedure. The skin overlying the right upper abdominal  quadrant was prepped and draped in the usual sterile fashion. The overlying soft tissues were anesthetized with 1% lidocaine with epinephrine. Under direct ultrasound guidance, the dominant hypoechoic component within the cranial medial aspect of the multiloculated abscess was cannulated with an 18 gauge trocar needle. An Amplatz wire was coiled within the dominant component of the abscess. Appropriate position was confirmed with CT imaging and the track was serially dilated allowing placement of a 12 French percutaneous drainage catheter. Appropriate position was confirmed with a limited CT scan. Next, the dominant hypoechoic component within in the inferolateral aspect of the multiloculated abscess was cannulated with an 18 gauge trocar needle. An Amplatz wire was coiled within this component of the abscess. Appropriate position was confirmed with CT imaging and the track was serially dilated allowing placement of a 12 French percutaneous drainage catheter. Appropriate positioning was confirmed with a limited CT scan. Ultimately, approximately 200 cc of purulent, slightly blood tinged fluid was aspirated from the abscess. Both hepatic abscess drainage catheters were connected to JP bulb is and sutured in place. Dressings were placed. The patient tolerated the above procedures well without immediate postprocedural complication. IMPRESSION: Successful ultrasound and CT-guided  placement of two 12 French percutaneous hepatic abscess drainage catheters. One of the hepatic abscess drainage catheters is coiled within the dominant component within the cranial-medial aspect of the multiloculated hepatic abscess while the additional drainage catheter is coiled within the dominant component of the inferolateral aspect of the hepatic abscess. Samples were sent to the laboratory as requested by the ordering clinical team. Electronically Signed   By: Sandi Mariscal M.D.   On: 01/25/2017 13:37   Ct Image Guided Drainage Percut  Cath  Peritoneal Retroperit  Result Date: 01/25/2017 INDICATION: Remote history of cholecystectomy complicated by a biliary injury requiring creation of a hepaticojejunostomy complicated by a recurrent biliary stricture requiring prolonged biliary catheter placement with multiple subsequent biliary angioplasties (per pt report), all of which was performed at outside institutions. Patient now presents with right upper quadrant abdominal pain, fever and chills with cross-sectional imaging concerning for development of a large multi-locular abscess within the right lobe of the liver. Request made for percutaneous drainage catheter placement for infection source control purposes. EXAM: ULTRASOUND AND CT-GUIDED HEPATIC ABSCESS DRAINAGE CATHETER PLACEMENT X2 COMPARISON:  CT abdomen pelvis - 01/23/2017; abdominal MRI - 01/25/2017 MEDICATIONS: The patient is currently admitted to the hospital and receiving intravenous antibiotics. The antibiotics were administered within an appropriate time frame prior to the initiation of the procedure. ANESTHESIA/SEDATION: Moderate (conscious) sedation was employed during this procedure. A total of Versed 4 mg and Fentanyl 250 mcg was administered intravenously. Moderate Sedation Time: 36 minutes. The patient's level of consciousness and vital signs were monitored continuously by radiology nursing throughout the procedure under my direct supervision. CONTRAST:  None COMPLICATIONS: None immediate. PROCEDURE: Informed written consent was obtained from the patient after a discussion of the risks, benefits and alternatives to treatment. The patient was placed supine on the CT gantry and a pre procedural CT was performed re-demonstrating the known abscess/fluid collection within the right lobe of the liver with dominant ill-defined component measuring at least 10.0 x 8.6 cm (image 33, series 2). The procedure was planned. A timeout was performed prior to the initiation of the procedure. The  skin overlying the right upper abdominal quadrant was prepped and draped in the usual sterile fashion. The overlying soft tissues were anesthetized with 1% lidocaine with epinephrine. Under direct ultrasound guidance, the dominant hypoechoic component within the cranial medial aspect of the multiloculated abscess was cannulated with an 18 gauge trocar needle. An Amplatz wire was coiled within the dominant component of the abscess. Appropriate position was confirmed with CT imaging and the track was serially dilated allowing placement of a 12 French percutaneous drainage catheter. Appropriate position was confirmed with a limited CT scan. Next, the dominant hypoechoic component within in the inferolateral aspect of the multiloculated abscess was cannulated with an 18 gauge trocar needle. An Amplatz wire was coiled within this component of the abscess. Appropriate position was confirmed with CT imaging and the track was serially dilated allowing placement of a 12 French percutaneous drainage catheter. Appropriate positioning was confirmed with a limited CT scan. Ultimately, approximately 200 cc of purulent, slightly blood tinged fluid was aspirated from the abscess. Both hepatic abscess drainage catheters were connected to JP bulb is and sutured in place. Dressings were placed. The patient tolerated the above procedures well without immediate postprocedural complication. IMPRESSION: Successful ultrasound and CT-guided placement of two 12 French percutaneous hepatic abscess drainage catheters. One of the hepatic abscess drainage catheters is coiled within the dominant component within the cranial-medial aspect of the multiloculated  hepatic abscess while the additional drainage catheter is coiled within the dominant component of the inferolateral aspect of the hepatic abscess. Samples were sent to the laboratory as requested by the ordering clinical team. Electronically Signed   By: Sandi Mariscal M.D.   On: 01/25/2017  13:37       Subjective: No new complaints.   Discharge Exam: Vitals:   01/30/17 0505 01/30/17 1500  BP: 125/72 (!) 108/56  Pulse: 84 83  Resp: 20 18  Temp: 98.1 F (36.7 C) 97.9 F (36.6 C)  SpO2: 96% 100%   Vitals:   01/29/17 1300 01/29/17 2035 01/30/17 0505 01/30/17 1500  BP: (!) 102/54 (!) 112/54 125/72 (!) 108/56  Pulse: 84 97 84 83  Resp: _0 Temp: 98.1 F (36.7 C) 97.8 F (36.6 C) 98.1 F (36.7 C) 97.9 F (36.6 C)  TempSrc: Oral Oral Oral Oral  SpO2: 98% 95% 96% 100%  Weight:      Height:        General: Pt is alert, awake, not in acute distress Cardiovascular: RRR, S1/S2 +, no rubs, no gallops Respiratory: CTA bilaterally, no wheezing, no rhonchi Abdominal: Soft, NT, ND, bowel sounds + Extremities: no edema, no cyanosis    The results of significant diagnostics from this hospitalization (including imaging, microbiology, ancillary and laboratory) are listed below for reference.     Microbiology: Recent Results (from the past 240 hour(s))  Aerobic/Anaerobic Culture (surgical/deep wound)     Status: None   Collection Time: 01/25/17  1:50 PM  Result Value Ref Range Status   Specimen Description ABSCESS LIVER  Final   Special Requests Normal  Final   Gram Stain   Final    ABUNDANT WBC PRESENT,BOTH PMN AND MONONUCLEAR MODERATE GRAM POSITIVE COCCI IN PAIRS IN CHAINS RARE GRAM VARIABLE ROD    Culture   Final    MODERATE ESCHERICHIA COLI MODERATE VIRIDANS STREPTOCOCCUS NO ANAEROBES ISOLATED Performed at [facility], [place] [address] [place], [place] [address]    Report Status 01/30/2017 FINAL  Final   Organism ID, Bacteria ESCHERICHIA COLI  Final      Susceptibility   Escherichia coli - MIC*    AMPICILLIN 4 SENSITIVE Sensitive     CEFAZOLIN <=4 SENSITIVE Sensitive     CEFEPIME <=1 SENSITIVE Sensitive     CEFTAZIDIME <=1 SENSITIVE Sensitive     CEFTRIAXONE <=1 SENSITIVE Sensitive     CIPROFLOXACIN <=0.25 SENSITIVE Sensitive      GENTAMICIN <=1 SENSITIVE Sensitive     IMIPENEM <=0.25 SENSITIVE Sensitive     TRIMETH/SULFA <=20 SENSITIVE Sensitive     AMPICILLIN/SULBACTAM <=2 SENSITIVE Sensitive     PIP/TAZO <=4 SENSITIVE Sensitive     Extended ESBL NEGATIVE Sensitive     * MODERATE ESCHERICHIA COLI     Labs: BNP (last 3 results) No results for input(s): BNP in the last 8760 hours. Basic Metabolic Panel: Recent Labs  Lab 01/26/17 0622 01/27/17 0551 01/28/17 0551 01/31/17 2015  NA 136 138 134* 137  K 3.0* 3.5 4.2 3.7  CL 106 106 103 103  CO2 _1 GLUCOSE 80 80 85 83  BUN _2 CREATININE 0.58 0.52 0.49 0.66  CALCIUM 7.7* 7.7* 7.8* 8.4*   Liver Function Tests: Recent Labs  Lab 01/26/17 0622 01/27/17 0551 01/28/17 0551  AST 52* 43* 36  ALT 66* 57* 46  ALKPHOS 114 117 114  BILITOT 1.4* 1.4* 1.2  PROT 5.9*  5.9* 5.9*  ALBUMIN 1.8* 1.8* 2.0*   No results for input(s): LIPASE, AMYLASE in the last 168 hours. No results for input(s): AMMONIA in the last 168 hours. CBC: Recent Labs  Lab 01/27/17 0551 01/28/17 0551 01/29/17 0400 01/30/17 0519 01/31/17 2015  WBC 12.3* 13.7* 11.4* 11.8* 9.9  HGB 9.1* 9.3* 9.0* 9.3* 10.7*  HCT 26.6* 28.2* 27.7* 29.1* 32.8*  MCV 85.3 87.0 87.9 88.2 88.4  PLT 644* 446* 813* 912* 916*   Cardiac Enzymes: No results for input(s): CKTOTAL, CKMB, CKMBINDEX, TROPONINI in the last 168 hours. BNP: Invalid input(s): POCBNP CBG: No results for input(s): GLUCAP in the last 168 hours. D-Dimer No results for input(s): DDIMER in the last 72 hours. Hgb A1c No results for input(s): HGBA1C in the last 72 hours. Lipid Profile No results for input(s): CHOL, HDL, LDLCALC, TRIG, CHOLHDL, LDLDIRECT in the last 72 hours. Thyroid function studies No results for input(s): TSH, T4TOTAL, T3FREE, THYROIDAB in the last 72 hours.  Invalid input(s): FREET3 Anemia work up No results for input(s): VITAMINB12, FOLATE, FERRITIN, TIBC, IRON, RETICCTPCT in the last 72  hours. Urinalysis    Component Value Date/Time   COLORURINE AMBER (A) 01/23/2017 1921   APPEARANCEUR HAZY (A) 01/23/2017 1921   LABSPEC 1.010 01/23/2017 1921   PHURINE 6.0 01/23/2017 1921   GLUCOSEU NEGATIVE 01/23/2017 1921   HGBUR LARGE (A) 01/23/2017 1921   BILIRUBINUR MODERATE (A) 01/23/2017 [place] NEGATIVE 01/23/2017 1921   PROTEINUR 30 (A) 01/23/2017 1921   NITRITE NEGATIVE 01/23/2017 1921   LEUKOCYTESUR NEGATIVE 01/23/2017 1921   Sepsis Labs Invalid input(s): PROCALCITONIN,  WBC,  LACTICIDVEN Microbiology Recent Results (from the past 240 hour(s))  Aerobic/Anaerobic Culture (surgical/deep wound)     Status: None   Collection Time: 01/25/17  1:50 PM  Result Value Ref Range Status   Specimen Description ABSCESS LIVER  Final   Special Requests Normal  Final   Gram Stain   Final    ABUNDANT WBC PRESENT,BOTH PMN AND MONONUCLEAR MODERATE GRAM POSITIVE COCCI IN PAIRS IN CHAINS RARE GRAM VARIABLE ROD    Culture   Final    MODERATE ESCHERICHIA COLI MODERATE VIRIDANS STREPTOCOCCUS NO ANAEROBES ISOLATED Performed at [facility], [place] [address], [place], [place] [address]    Report Status 01/30/2017 FINAL  Final   Organism ID, Bacteria ESCHERICHIA COLI  Final      Susceptibility   Escherichia coli - MIC*    AMPICILLIN 4 SENSITIVE Sensitive     CEFAZOLIN <=4 SENSITIVE Sensitive     CEFEPIME <=1 SENSITIVE Sensitive     CEFTAZIDIME <=1 SENSITIVE Sensitive     CEFTRIAXONE <=1 SENSITIVE Sensitive     CIPROFLOXACIN <=0.25 SENSITIVE Sensitive     GENTAMICIN <=1 SENSITIVE Sensitive     IMIPENEM <=0.25 SENSITIVE Sensitive     TRIMETH/SULFA <=20 SENSITIVE Sensitive     AMPICILLIN/SULBACTAM <=2 SENSITIVE Sensitive     PIP/TAZO <=4 SENSITIVE Sensitive     Extended ESBL NEGATIVE Sensitive     * MODERATE ESCHERICHIA COLI     Time coordinating discharge: Over 30 minutes  SIGNED:   Hosie Poisson, MD  [facility] 02/01/2017, 9:36 AM Pager   If 7PM-7AM,  please contact night-coverage [email or web address] Password [email or web address]

## 2017-02-02 DIAGNOSIS — K839 Disease of biliary tract, unspecified: Secondary | ICD-10-CM | POA: Diagnosis not present

## 2017-02-02 DIAGNOSIS — K750 Abscess of liver: Secondary | ICD-10-CM | POA: Diagnosis not present

## 2017-02-02 DIAGNOSIS — D649 Anemia, unspecified: Secondary | ICD-10-CM | POA: Diagnosis not present

## 2017-02-02 LAB — PATHOLOGIST SMEAR REVIEW

## 2017-02-04 ENCOUNTER — Ambulatory Visit
Admission: RE | Admit: 2017-02-04 | Discharge: 2017-02-04 | Disposition: A | Discharge status: Home / Self Care | Payer: BLUE CROSS/BLUE SHIELD | Source: Ambulatory Visit | Attending: General Surgery | Admitting: General Surgery

## 2017-02-04 ENCOUNTER — Encounter: Payer: Self-pay | Admitting: *Deleted

## 2017-02-04 ENCOUNTER — Other Ambulatory Visit: Payer: Self-pay | Admitting: Internal Medicine

## 2017-02-04 ENCOUNTER — Ambulatory Visit
Admission: RE | Admit: 2017-02-04 | Discharge: 2017-02-04 | Disposition: A | Discharge status: Home / Self Care | Payer: BLUE CROSS/BLUE SHIELD | Source: Ambulatory Visit | Attending: Internal Medicine | Admitting: Internal Medicine

## 2017-02-04 DIAGNOSIS — K750 Abscess of liver: Secondary | ICD-10-CM | POA: Diagnosis not present

## 2017-02-04 DIAGNOSIS — Z9049 Acquired absence of other specified parts of digestive tract: Secondary | ICD-10-CM | POA: Diagnosis not present

## 2017-02-04 DIAGNOSIS — Z4659 Encounter for fitting and adjustment of other gastrointestinal appliance and device: Secondary | ICD-10-CM | POA: Diagnosis not present

## 2017-02-04 HISTORY — PX: IR RADIOLOGIST EVAL & MGMT: IMG5224

## 2017-02-04 MED ORDER — IOPAMIDOL (ISOVUE-300) INJECTION 61%
100.0000 mL | Freq: Once | INTRAVENOUS | Status: AC | PRN
  Administered 2017-02-04: 100 mL via INTRAVENOUS

## 2017-02-04 NOTE — Progress Notes (Signed)
Chief Complaint: Patient was seen in consultation today for  Chief Complaint  Patient presents with  . Follow-up    follow up abscess drain    Referring Physician(s): Carlyle Basques  Supervising Physician: Corrie Mckusick  History of Present Illness: Molly Baker is a 47 y.o. female with a history of a cholecystectomy complicated by a biliary injury requiring creation of a hepaticojejunostomy complicated by a recurrent biliary stricture requiring prolonged biliary catheter placement with multiple subsequent biliary angioplasties over 20 years ago.    She presented to the ED on 12/29 due to fevers and chills where she was found to have a WBC of 21K and a CT scan that revealed either a liver lesion or fluid collection.    She underwent placement of two 12 french drainage catheters by Dr. Pascal Lux on 01/25/2017.  She is here today for follow up and review of her CT scan.  She has kept good record of her output. She has about 20 to 30 mL output per day. One day one will have more drainage, then the next day the other will.  The drainage is serosanguinous. No purulence. Non-bilious.  She states that sometimes when she eats, she has more drainage.  She remains on IV abx via PICC.  She denies fever/chills, N/V, or abdominal pain.  No past medical history on file.  Past Surgical History:  Procedure Laterality Date  . ABDOMINAL HYSTERECTOMY     partial  . CHOLECYSTECTOMY    . common bile duct reconstruction    . IR RADIOLOGIST EVAL & MGMT  02/04/2017    Allergies: Ciprofloxacin; Codeine; Dilaudid [hydromorphone hcl]; Morphine and related; Penicillins; Percocet [oxycodone-acetaminophen]; Phenergan [promethazine hcl]; Zosyn [piperacillin-tazobactam in dex]; Flagyl [metronidazole]; Rocephin [ceftriaxone sodium in dextrose]; and Toradol [ketorolac tromethamine]  Medications: Prior to Admission medications   Medication Sig Start Date End Date Taking? Authorizing Provider    buPROPion (WELLBUTRIN XL) 300 MG 24 hr tablet Take 300 mg by mouth daily after breakfast.    Yes [provider]  ertapenem (INVANZ) IVPB Inject 1 g into the vein daily. Indication:  Hepatic abscess Last Day of Therapy:  02/15/17 Labs - Once weekly:  CBC/D and BMP, Labs - Every other week:  ESR and CRP 01/30/17  Yes Hosie Poisson, MD  esomeprazole (NEXIUM) 20 MG capsule Take 40 mg by mouth at bedtime.   Yes [provider]  polyethylene glycol (MIRALAX / GLYCOLAX) packet Take 17 g by mouth daily. Patient not taking: Reported on 02/04/2017 01/31/17   Hosie Poisson, MD     Family History  Problem Relation Age of Onset  . Breast cancer Maternal Grandmother     Social History   Socioeconomic History  . Marital status: Married    Spouse name: Not on file  . Number of children: Not on file  . Years of education: Not on file  . Highest education level: Not on file  Social Needs  . Financial resource strain: Not on file  . Food insecurity - worry: Not on file  . Food insecurity - inability: Not on file  . Transportation needs - medical: Not on file  . Transportation needs - non-medical: Not on file  Occupational History  . Not on file  Tobacco Use  . Smoking status: Never Smoker  . Smokeless tobacco: Never Used  Substance and Sexual Activity  . Alcohol use: Yes    Comment: occassionally  . Drug use: No  . Sexual activity: Not on file  Other  Topics Concern  . Not on file  Social History Narrative  . Not on file    Review of Systems: A 12 point ROS discussed  Review of Systems  Constitutional: Negative.   HENT: Negative.   Respiratory: Negative.   Cardiovascular: Negative.   Gastrointestinal: Negative.   Genitourinary: Negative.   Musculoskeletal: Negative.   Skin: Negative.   Neurological: Negative.   Hematological: Negative.   Psychiatric/Behavioral: Negative.     Vital Signs: BP 138/70   Pulse 96   Temp 98.2 F (36.8 C) (Oral)   Resp 14   Ht 5'  4" (1.626 m)   Wt 200 lb (90.7 kg)   LMP 04/03/2015   SpO2 96%   BMI 34.33 kg/m   Physical Exam  Constitutional: She appears well-developed.  HENT:  Head: Normocephalic and atraumatic.  Eyes: EOM are normal.  Neck: Normal range of motion.  Cardiovascular:  Pulse 96  Pulmonary/Chest: Effort normal.  Abdominal: Soft.  Drains in place, no purulence, non-bilious, serosanguinous fluid.  Vitals reviewed.    Imaging: Dg Chest 2 View  Result Date: 01/31/2017 CLINICAL DATA:  Chest pain EXAM: CHEST  2 VIEW COMPARISON:  None. FINDINGS: Two views study shows low volumes with mild asymmetric elevation of the right hemidiaphragm. There is a small right pleural effusion with right base atelectasis. Left lung clear. Heart size normal. Right PICC line tip overlies the distal SVC. Pigtail drainage catheters overly the right upper quadrant in this patient with known hepatic abscess. IMPRESSION: Low volume film with right base atelectasis and small right pleural effusion. Electronically Signed   By: Misty Stanley M.D.   On: 01/31/2017 19:27   Ct Abdomen Pelvis W Contrast  Result Date: 02/04/2017 CLINICAL DATA:  Status post percutaneous catheter drainage of large right lobe hepatic abscess with placement of 2 separate percutaneous drainage catheters on 01/25/2017. There remains some serosanguineous output from both drainage catheters and the patient is also on IV antibiotic therapy. History of prior biliary reconstruction due to bile duct injury after cholecystectomy and prior prolonged biliary drainage catheter placement and subsequent biliary angioplasty procedures at [facility]. EXAM: CT ABDOMEN AND PELVIS WITH CONTRAST TECHNIQUE: Multidetector CT imaging of the abdomen and pelvis was performed using the standard protocol following bolus administration of intravenous contrast. CONTRAST:  140m ISOVUE-300 IOPAMIDOL (ISOVUE-300) INJECTION 61% COMPARISON:  01/25/2017 and 01/23/2017. FINDINGS: Lower chest:  There is atelectasis at the right lung base. Hepatobiliary: Two percutaneous drainage catheters remain in place adjacent to one another in the superior right lobe of the liver. The large, complex multilocular abscess appears greatly improved after drainage with small pockets of positional fluid remaining adjacent to the drains. The largest single liquefied pocket measures approximately 2.3 cm. Several small pockets of fluid remain. There also is some air and fluid tracking in the biliary tree towards the central liver. Underlying steatosis again noted without overt cirrhosis. No solid hepatic masses. Pancreas: Unremarkable. No pancreatic ductal dilatation or surrounding inflammatory changes. Spleen: Normal in size without focal abnormality. Adrenals/Urinary Tract: Adrenal glands are unremarkable. Kidneys are normal, without renal calculi, focal lesion, or hydronephrosis. Bladder is unremarkable. Stomach/Bowel: Bowel shows no evidence of obstruction or inflammation. No free air. Vascular/Lymphatic: No significant vascular findings are present. No enlarged abdominal or pelvic lymph nodes. Reproductive: Status post hysterectomy. No adnexal masses. Other: No abdominal wall hernia or abnormality. No abdominopelvic ascites. Musculoskeletal: No acute or significant osseous findings. IMPRESSION: Significant improvement in hepatic abscess after percutaneous drainage. Small pockets of fluid remain  present adjacent to the 2 indwelling drainage catheters. There is some air and fluid in the adjacent biliary tree tracking towards the central liver. This likely relates to the history of prior biliary reconstruction. Once drain output diminishes significantly, the patient will be set up for injection of the drainage catheters under fluoroscopy prior to drain removal to make sure that there are no fistulas to the biliary tree present prior to drain removal. Electronically Signed   By: Aletta Edouard M.D.   On: 02/04/2017 14:19    Ct Abdomen Pelvis W Contrast  Result Date: 01/23/2017 CLINICAL DATA:  47 year old female with epigastric pain and fever. Prior cholecystectomy. EXAM: CT ABDOMEN AND PELVIS WITH CONTRAST TECHNIQUE: Multidetector CT imaging of the abdomen and pelvis was performed using the standard protocol following bolus administration of intravenous contrast. CONTRAST:  134m ISOVUE-300 IOPAMIDOL (ISOVUE-300) INJECTION 61% COMPARISON:  None. FINDINGS: Lower chest: There is mild eventration of the right hemidiaphragm with right lung base atelectatic changes. The left lung base is clear. No intra-abdominal free air or free fluid. Hepatobiliary: There is diffuse fatty infiltration of the liver. The liver is enlarged measuring 18 cm in midclavicular length. There is an 11 x 11 cm complex mass with cystic or necrotic areas in the right lobe of the liver with mild surrounding edema. There is associated mass effect and displacement of the adjacent liver parenchyma and vasculature. This lesion is not well characterized but may represent a neoplasm or an infectious process/abscess. Correlation with clinical exam and further evaluation with dedicated MRI without and with contrast recommended. There is postsurgical changes of cholecystectomy. There is pneumobilia. Pancreas: Unremarkable. No pancreatic ductal dilatation or surrounding inflammatory changes. Spleen: Normal in size without focal abnormality. Adrenals/Urinary Tract: The adrenal glands are unremarkable. Amorphous calcification of the inferior pole of the right kidney measuring 10 mm in diameter. There is no hydronephrosis on either side. The visualized ureters and urinary bladder appear unremarkable. Stomach/Bowel: No bowel obstruction or active inflammation. The appendix is not visualized with certainty. No inflammatory changes identified in the right lower quadrant. Vascular/Lymphatic: No significant vascular findings are present. No enlarged abdominal or pelvic lymph  nodes. Reproductive: Hysterectomy. No pelvic mass. The ovaries are unremarkable. Other: None Musculoskeletal: No acute or significant osseous findings. IMPRESSION: 1. Large complex on mass in the right lobe of the liver with mild surrounding edema. This may represent a malignancy or an infectious process/abscess. Further evaluation with MRI recommended. 2. Hepatomegaly with fatty infiltration of the liver. 3. Cholecystectomy with pneumobilia. 4. No bowel obstruction or active inflammation. 5. Focus of amorphous calcific density in the inferior pole of the right kidney. Electronically Signed   By: AAnner CreteM.D.   On: 01/23/2017 22:28   Mr Liver W Wo Contrast  Result Date: 01/25/2017 CLINICAL DATA:  47year old female inpatient admitted with epigastric abdominal pain, nausea and fever with right liver dome mass on CT. Remote cholecystectomy, reportedly complicated by bile duct injury. EXAM: MRI ABDOMEN WITHOUT AND WITH CONTRAST TECHNIQUE: Multiplanar multisequence MR imaging of the abdomen was performed both before and after the administration of intravenous contrast. CONTRAST:  20 cc MultiHance IV. COMPARISON:  01/23/2017 CT abdomen/ pelvis. FINDINGS: Lower chest: Elevation of the right hemidiaphragm. Mild-to-moderate right basilar atelectasis. A few mildly enlarged right pericardiophrenic nodes measuring up to 1.0 cm (series 1003/ image 28). Hepatobiliary: Mild hepatomegaly. Moderate to severe diffuse hepatic steatosis. There is a 12.3 x 8.9 x 10.6 cm right superior liver mass with lobular contour centered within segment  8 of the right liver lobe, with extension into liver segments 4A and 7, which demonstrates thick enhancing wall, numerous thick enhancing internal septations and prominent restricted diffusion, compatible with an hepatic abscess. A few additional scattered subcentimeter foci of T2 hyperintensity are noted in the anterior right liver lobe, which demonstrate no appreciable enhancement.  Status post cholecystectomy. There is prominent intrahepatic biliary ductal dilatation isolated to segments 5, 7 and 8 in the right liver lobe, with sharp tapering of the dilated intrahepatic right liver lobe bile ducts centrally near the porta hepatis, without an appreciable obstructing mass or stone in this location. There is evidence of a biliary enteric anastomosis associated with the biliary stricture site. Scattered low signal intensity in the nondependent portion of the dilated intrahepatic right liver lobe bile ducts is compatible with pneumobilia as seen on the CT study. The left liver lobe and inferior right liver lobe intrahepatic bile ducts are normal caliber. Common bile duct diameter 8 mm. No evidence of choledocholithiasis. Pancreas: No pancreatic mass or duct dilation.  No pancreas divisum. Spleen: Normal size. No mass. Adrenals/Urinary Tract: Normal adrenals. Normal size kidneys. No hydronephrosis. There is a T2 hypointense 0.9 cm renal cortical focus in the inferior right kidney (series 7/ image 56) without enhancement on the subtraction sequences, correlating with a focus of calcification on the recent CT study, most compatible with scarring or a calcified hemorrhagic cyst. Subcentimeter simple lower left renal cyst. No suspicious renal masses. Stomach/Bowel: Grossly normal stomach. Visualized small and large bowel is normal caliber, with no bowel wall thickening. Vascular/Lymphatic: Normal caliber abdominal aorta. Patent portal, splenic, hepatic and renal veins. Mild porta hepatis adenopathy measuring up to the 1.6 cm (series 1005/ image 61). Other: No ascites.  No additional fluid collections. Musculoskeletal: No aggressive appearing focal osseous lesions. IMPRESSION: 1. Large 12.3 cm right superior hepatic abscess, multilocular. Additional subcentimeter scattered T2 hyperintense nonenhancing right liver foci, probably additional tiny satellite abscesses or cysts. 2. Evidence of a high-grade  stricture at the site of a biliary-enteric anastomosis draining segments 5, 7 and 8 of the right liver lobe. No evidence of obstructing mass or stone. 3. Normal caliber CBD (8 mm diameter) status post cholecystectomy. Left liver lobe and inferior right liver lobe bile ducts are normal caliber. 4. Nonspecific pericardiophrenic and porta hepatis adenopathy, probably reactive. Recommend post treatment MRI abdomen without and with IV contrast in 3 months . 5. Elevation of the right hemidiaphragm with right basilar atelectasis . 6. Moderate to severe diffuse hepatic steatosis. These results were called by telephone at the time of interpretation on 01/25/2017 at 12:20 pm to Dr. Ronny Bacon, who verbally acknowledged these results. Electronically Signed   By: Ilona Sorrel M.D.   On: 01/25/2017 12:22   Ct Image Guided Drainage Percut Cath  Peritoneal Retroperit  Result Date: 01/25/2017 INDICATION: Remote history of cholecystectomy complicated by a biliary injury requiring creation of a hepaticojejunostomy complicated by a recurrent biliary stricture requiring prolonged biliary catheter placement with multiple subsequent biliary angioplasties (per pt report), all of which was performed at outside institutions. Patient now presents with right upper quadrant abdominal pain, fever and chills with cross-sectional imaging concerning for development of a large multi-locular abscess within the right lobe of the liver. Request made for percutaneous drainage catheter placement for infection source control purposes. EXAM: ULTRASOUND AND CT-GUIDED HEPATIC ABSCESS DRAINAGE CATHETER PLACEMENT X2 COMPARISON:  CT abdomen pelvis - 01/23/2017; abdominal MRI - 01/25/2017 MEDICATIONS: The patient is currently admitted to the hospital and  receiving intravenous antibiotics. The antibiotics were administered within an appropriate time frame prior to the initiation of the procedure. ANESTHESIA/SEDATION: Moderate (conscious) sedation was  employed during this procedure. A total of Versed 4 mg and Fentanyl 250 mcg was administered intravenously. Moderate Sedation Time: 36 minutes. The patient's level of consciousness and vital signs were monitored continuously by radiology nursing throughout the procedure under my direct supervision. CONTRAST:  None COMPLICATIONS: None immediate. PROCEDURE: Informed written consent was obtained from the patient after a discussion of the risks, benefits and alternatives to treatment. The patient was placed supine on the CT gantry and a pre procedural CT was performed re-demonstrating the known abscess/fluid collection within the right lobe of the liver with dominant ill-defined component measuring at least 10.0 x 8.6 cm (image 33, series 2). The procedure was planned. A timeout was performed prior to the initiation of the procedure. The skin overlying the right upper abdominal quadrant was prepped and draped in the usual sterile fashion. The overlying soft tissues were anesthetized with 1% lidocaine with epinephrine. Under direct ultrasound guidance, the dominant hypoechoic component within the cranial medial aspect of the multiloculated abscess was cannulated with an 18 gauge trocar needle. An Amplatz wire was coiled within the dominant component of the abscess. Appropriate position was confirmed with CT imaging and the track was serially dilated allowing placement of a 12 French percutaneous drainage catheter. Appropriate position was confirmed with a limited CT scan. Next, the dominant hypoechoic component within in the inferolateral aspect of the multiloculated abscess was cannulated with an 18 gauge trocar needle. An Amplatz wire was coiled within this component of the abscess. Appropriate position was confirmed with CT imaging and the track was serially dilated allowing placement of a 12 French percutaneous drainage catheter. Appropriate positioning was confirmed with a limited CT scan. Ultimately, approximately  200 cc of purulent, slightly blood tinged fluid was aspirated from the abscess. Both hepatic abscess drainage catheters were connected to JP bulb is and sutured in place. Dressings were placed. The patient tolerated the above procedures well without immediate postprocedural complication. IMPRESSION: Successful ultrasound and CT-guided placement of two 12 French percutaneous hepatic abscess drainage catheters. One of the hepatic abscess drainage catheters is coiled within the dominant component within the cranial-medial aspect of the multiloculated hepatic abscess while the additional drainage catheter is coiled within the dominant component of the inferolateral aspect of the hepatic abscess. Samples were sent to the laboratory as requested by the ordering clinical team. Electronically Signed   By: Sandi Mariscal M.D.   On: 01/25/2017 13:37   Ct Image Guided Drainage Percut Cath  Peritoneal Retroperit  Result Date: 01/25/2017 INDICATION: Remote history of cholecystectomy complicated by a biliary injury requiring creation of a hepaticojejunostomy complicated by a recurrent biliary stricture requiring prolonged biliary catheter placement with multiple subsequent biliary angioplasties (per pt report), all of which was performed at outside institutions. Patient now presents with right upper quadrant abdominal pain, fever and chills with cross-sectional imaging concerning for development of a large multi-locular abscess within the right lobe of the liver. Request made for percutaneous drainage catheter placement for infection source control purposes. EXAM: ULTRASOUND AND CT-GUIDED HEPATIC ABSCESS DRAINAGE CATHETER PLACEMENT X2 COMPARISON:  CT abdomen pelvis - 01/23/2017; abdominal MRI - 01/25/2017 MEDICATIONS: The patient is currently admitted to the hospital and receiving intravenous antibiotics. The antibiotics were administered within an appropriate time frame prior to the initiation of the procedure.  ANESTHESIA/SEDATION: Moderate (conscious) sedation was employed during this procedure. A  total of Versed 4 mg and Fentanyl 250 mcg was administered intravenously. Moderate Sedation Time: 36 minutes. The patient's level of consciousness and vital signs were monitored continuously by radiology nursing throughout the procedure under my direct supervision. CONTRAST:  None COMPLICATIONS: None immediate. PROCEDURE: Informed written consent was obtained from the patient after a discussion of the risks, benefits and alternatives to treatment. The patient was placed supine on the CT gantry and a pre procedural CT was performed re-demonstrating the known abscess/fluid collection within the right lobe of the liver with dominant ill-defined component measuring at least 10.0 x 8.6 cm (image 33, series 2). The procedure was planned. A timeout was performed prior to the initiation of the procedure. The skin overlying the right upper abdominal quadrant was prepped and draped in the usual sterile fashion. The overlying soft tissues were anesthetized with 1% lidocaine with epinephrine. Under direct ultrasound guidance, the dominant hypoechoic component within the cranial medial aspect of the multiloculated abscess was cannulated with an 18 gauge trocar needle. An Amplatz wire was coiled within the dominant component of the abscess. Appropriate position was confirmed with CT imaging and the track was serially dilated allowing placement of a 12 French percutaneous drainage catheter. Appropriate position was confirmed with a limited CT scan. Next, the dominant hypoechoic component within in the inferolateral aspect of the multiloculated abscess was cannulated with an 18 gauge trocar needle. An Amplatz wire was coiled within this component of the abscess. Appropriate position was confirmed with CT imaging and the track was serially dilated allowing placement of a 12 French percutaneous drainage catheter. Appropriate positioning was  confirmed with a limited CT scan. Ultimately, approximately 200 cc of purulent, slightly blood tinged fluid was aspirated from the abscess. Both hepatic abscess drainage catheters were connected to JP bulb is and sutured in place. Dressings were placed. The patient tolerated the above procedures well without immediate postprocedural complication. IMPRESSION: Successful ultrasound and CT-guided placement of two 12 French percutaneous hepatic abscess drainage catheters. One of the hepatic abscess drainage catheters is coiled within the dominant component within the cranial-medial aspect of the multiloculated hepatic abscess while the additional drainage catheter is coiled within the dominant component of the inferolateral aspect of the hepatic abscess. Samples were sent to the laboratory as requested by the ordering clinical team. Electronically Signed   By: Sandi Mariscal M.D.   On: 01/25/2017 13:37   Ir Radiologist Eval & Mgmt  Result Date: 02/04/2017 Please refer to notes tab for details about interventional procedure. (Op Note)   Labs:  CBC: Recent Labs    01/28/17 0551 01/29/17 0400 01/30/17 0519 01/31/17 2015  WBC 13.7* 11.4* 11.8* 9.9  HGB 9.3* 9.0* 9.3* 10.7*  HCT 28.2* 27.7* 29.1* 32.8*  PLT 446* 813* 912* 916*    COAGS: Recent Labs    01/25/17 0925  INR 1.37    BMP: Recent Labs    01/26/17 0622 01/27/17 0551 01/28/17 0551 01/31/17 2015  NA 136 138 134* 137  K 3.0* 3.5 4.2 3.7  CL 106 106 103 103  CO2 23 24 25 27   GLUCOSE 80 80 85 83  BUN 12 8 6 10   CALCIUM 7.7* 7.7* 7.8* 8.4*  CREATININE 0.58 0.52 0.49 0.66  GFRNONAA >60 >60 >60 >60  GFRAA >60 >60 >60 >60    LIVER FUNCTION TESTS: Recent Labs    01/23/17 2024 01/26/17 0622 01/27/17 0551 01/28/17 0551  BILITOT 2.6* 1.4* 1.4* 1.2  AST 68* 52* 43* 36  ALT 71*  66* 57* 46  ALKPHOS 119 114 117 114  PROT 7.0 5.9* 5.9* 5.9*  ALBUMIN 2.2* 1.8* 1.8* 2.0*    TUMOR MARKERS: No results for input(s): AFPTM, CEA,  CA199, CHROMGRNA in the last 8760 hours.  Assessment:  Dr. Kathlene Cote reviewed her CT scan and there is improvement.  Given her extensive complicated biliary reconstruction, he recommends for her to have both drains injected at the hospital before removal.  He would like to wait until her output is below 10 mL per drain before they are injected.   She needs to be schedule with either Dr. Kathlene Cote or Dr. Pascal Lux.   Electronically Signed: Murrell Redden PA-C 02/04/2017, 3:02 PM   Please refer to Dr. Pasty Arch attestation of this note for management and plan.

## 2017-02-05 DIAGNOSIS — K750 Abscess of liver: Secondary | ICD-10-CM | POA: Diagnosis not present

## 2017-02-08 ENCOUNTER — Other Ambulatory Visit: Payer: Self-pay | Admitting: Internal Medicine

## 2017-02-08 DIAGNOSIS — Z5181 Encounter for therapeutic drug level monitoring: Secondary | ICD-10-CM | POA: Diagnosis not present

## 2017-02-08 DIAGNOSIS — K750 Abscess of liver: Secondary | ICD-10-CM

## 2017-02-08 DIAGNOSIS — R509 Fever, unspecified: Secondary | ICD-10-CM

## 2017-02-08 DIAGNOSIS — F419 Anxiety disorder, unspecified: Secondary | ICD-10-CM

## 2017-02-08 DIAGNOSIS — L270 Generalized skin eruption due to drugs and medicaments taken internally: Secondary | ICD-10-CM

## 2017-02-10 ENCOUNTER — Ambulatory Visit (HOSPITAL_COMMUNITY)
Admission: RE | Admit: 2017-02-10 | Discharge: 2017-02-10 | Disposition: A | Discharge status: Home / Self Care | Payer: BLUE CROSS/BLUE SHIELD | Source: Ambulatory Visit | Attending: Interventional Radiology | Admitting: Interventional Radiology

## 2017-02-10 ENCOUNTER — Encounter (HOSPITAL_COMMUNITY): Payer: Self-pay | Admitting: Interventional Radiology

## 2017-02-10 ENCOUNTER — Other Ambulatory Visit: Payer: Self-pay | Admitting: Internal Medicine

## 2017-02-10 ENCOUNTER — Ambulatory Visit (HOSPITAL_COMMUNITY): Admission: RE | Admit: 2017-02-10 | Payer: BLUE CROSS/BLUE SHIELD | Source: Ambulatory Visit

## 2017-02-10 DIAGNOSIS — K750 Abscess of liver: Secondary | ICD-10-CM | POA: Diagnosis not present

## 2017-02-10 DIAGNOSIS — Z9889 Other specified postprocedural states: Secondary | ICD-10-CM | POA: Diagnosis not present

## 2017-02-10 HISTORY — PX: IR SINUS/FIST TUBE CHK-NON GI: IMG673

## 2017-02-10 MED ORDER — IOPAMIDOL (ISOVUE-300) INJECTION 61%
INTRAVENOUS | Status: AC
  Administered 2017-02-10: 10 mL
  Filled 2017-02-10: qty 50

## 2017-02-10 NOTE — Procedures (Signed)
Pre procedural Dx: hepatic abscess Post procedural Dx: Same  Fluoroscopic guided injection of both hepatic abscess drainage catheters demonstrates near complete resolution of the hepatic abscess.  Given lack of any substantial output from either drain for the past several day and near resolution of recent abdominal CT, both drains were removed at the pt's bedside without incident.   EBL: None  Complications: None immediate  Ronny Bacon, MD Pager #: [phone]

## 2017-02-11 DIAGNOSIS — K750 Abscess of liver: Secondary | ICD-10-CM | POA: Diagnosis not present

## 2017-02-12 ENCOUNTER — Other Ambulatory Visit: Payer: Self-pay | Admitting: Pharmacist

## 2017-02-16 DIAGNOSIS — K750 Abscess of liver: Secondary | ICD-10-CM | POA: Diagnosis not present

## 2017-02-16 DIAGNOSIS — Z5181 Encounter for therapeutic drug level monitoring: Secondary | ICD-10-CM | POA: Diagnosis not present

## 2017-02-17 ENCOUNTER — Ambulatory Visit (INDEPENDENT_AMBULATORY_CARE_PROVIDER_SITE_OTHER): Payer: BLUE CROSS/BLUE SHIELD | Admitting: Internal Medicine

## 2017-02-17 ENCOUNTER — Encounter: Payer: Self-pay | Admitting: Internal Medicine

## 2017-02-17 VITALS — BP 118/51 | HR 79 | Temp 98.0°F | Wt 209.0 lb

## 2017-02-17 DIAGNOSIS — L231 Allergic contact dermatitis due to adhesives: Secondary | ICD-10-CM

## 2017-02-17 DIAGNOSIS — K750 Abscess of liver: Secondary | ICD-10-CM

## 2017-02-17 DIAGNOSIS — Z889 Allergy status to unspecified drugs, medicaments and biological substances status: Secondary | ICD-10-CM

## 2017-02-17 MED ORDER — CEFPODOXIME PROXETIL 200 MG PO TABS: 200.0000 mg | ORAL_TABLET | Freq: Two times a day (BID) | ORAL | 1 refills | Status: AC

## 2017-02-17 MED ORDER — MOMETASONE FUROATE 0.1 % EX OINT: TOPICAL_OINTMENT | Freq: Every day | CUTANEOUS | 0 refills | Status: AC

## 2017-02-17 NOTE — Progress Notes (Signed)
RFV: follow up for hepatic abscess  Patient ID: Molly Baker, female   DOB: [birth year], 47 y.o.   MRN: [id number]  HPI 47yo F with history of hepatic abscess recently treated with Iv abtx which stopped on 1/21. Had various drug allergies.her energy is improving. Not having issues with eating. Denies LUQ pain.  Outpatient Encounter Medications as of 02/17/2017  Medication Sig  . buPROPion (WELLBUTRIN XL) 300 MG 24 hr tablet Take 300 mg by mouth daily after breakfast.   . esomeprazole (NEXIUM) 20 MG capsule Take 40 mg by mouth at bedtime.  . ertapenem (INVANZ) IVPB Inject 1 g into the vein daily. Indication:  Hepatic abscess Last Day of Therapy:  02/15/17 Labs - Once weekly:  CBC/D and BMP, Labs - Every other week:  ESR and CRP (Patient not taking: Reported on 02/17/2017)  . polyethylene glycol (MIRALAX / GLYCOLAX) packet Take 17 g by mouth daily. (Patient not taking: Reported on 02/04/2017)   No facility-administered encounter medications on file as of 02/17/2017.      Patient Active Problem List   Diagnosis Date Noted  . Fever   . Allergic drug rash   . Anxiety   . 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 History of common bile duct surgery 01/24/2017  . Hepatic abscess 01/23/2017     Health Maintenance Due  Topic Date Due  . TETANUS/TDAP  03/07/1988  . INFLUENZA VACCINE  08/26/2016  . PAP SMEAR  12/28/2016     Review of Systems +itchy rash at dressing site, otherwise 12 point ros is negative Physical Exam   BP (!) 118/51   Pulse 79   Temp 98 F (36.7 C) (Oral)   Wt 209 lb (94.8 kg)   LMP 04/03/2015   BMI 35.87 kg/m   Physical Exam  Constitutional:  oriented to person, place, and time. appears well-developed and well-nourished. No distress.  HENT: [place]/AT, PERRLA, no scleral icterus Mouth/Throat: Oropharynx is clear and moist. No oropharyngeal exudate.  Cardiovascular: Normal rate, regular rhythm and normal heart sounds. Exam reveals no gallop and no friction rub.   No murmur heard.  Pulmonary/Chest: Effort normal and breath sounds normal. No respiratory distress.  has no wheezes.  Abdominal: Soft. Bowel sounds are normal.  exhibits no distension. There is no tenderness.  Skin: right upper arm has well demarcated macular papular rash at picc line dressing site Psychiatric: a normal mood and affect.  behavior is normal.   CBC Lab Results  Component Value Date   WBC 9.9 01/31/2017   RBC 3.71 (L) 01/31/2017   HGB 10.7 (L) 01/31/2017   HCT 32.8 (L) 01/31/2017   PLT 916 (HH) 01/31/2017   MCV 88.4 01/31/2017   MCH 28.8 01/31/2017   MCHC 32.6 01/31/2017   RDW 15.4 01/31/2017   LYMPHSABS 2.6 01/23/2017   MONOABS 1.2 (H) 01/23/2017   EOSABS 0.0 01/23/2017    BMET Lab Results  Component Value Date   NA 137 01/31/2017   K 3.7 01/31/2017   CL 103 01/31/2017   CO2 27 01/31/2017   GLUCOSE 83 01/31/2017   BUN 10 01/31/2017   CREATININE 0.66 01/31/2017   CALCIUM 8.4 (L) 01/31/2017   GFRNONAA >60 01/31/2017   GFRAA >60 01/31/2017    ecoli -pansensitive  Assessment and Plan  Hepatic abscess = has finished up course of Iv ertapenem and repeat CT shows small residual collection but overall much improved. She had blood work done by home health yesterday when they pulled out her picc line  -  track blood work for advance - will do 2 wk of oral abtx. - will do cefpodox. There maybe a chance she develops rash.  if it doesn't work will change her over to bactrim--   Contact dermitis = mometasome steroid to use daily to tape..has had allergies to many ointments, and    Numerous drug allergy = she has seen allergist in the past, but also recalls having numerous intolerances to abtx, pain meds, lotions, etc If it gets worse would see Allergist

## 2017-02-18 ENCOUNTER — Other Ambulatory Visit: Payer: BLUE CROSS/BLUE SHIELD

## 2017-02-19 ENCOUNTER — Other Ambulatory Visit: Payer: Self-pay | Admitting: Pharmacist

## 2017-03-09 ENCOUNTER — Encounter: Payer: Self-pay | Admitting: Internal Medicine

## 2017-03-09 ENCOUNTER — Ambulatory Visit (INDEPENDENT_AMBULATORY_CARE_PROVIDER_SITE_OTHER): Payer: BLUE CROSS/BLUE SHIELD | Admitting: Internal Medicine

## 2017-03-09 VITALS — BP 130/84 | HR 91 | Temp 98.6°F | Wt 210.0 lb

## 2017-03-09 DIAGNOSIS — K750 Abscess of liver: Secondary | ICD-10-CM

## 2017-03-09 DIAGNOSIS — D75839 Thrombocytosis, unspecified: Secondary | ICD-10-CM

## 2017-03-09 DIAGNOSIS — Z889 Allergy status to unspecified drugs, medicaments and biological substances status: Secondary | ICD-10-CM

## 2017-03-09 DIAGNOSIS — D473 Essential (hemorrhagic) thrombocythemia: Secondary | ICD-10-CM | POA: Diagnosis not present

## 2017-03-09 DIAGNOSIS — L231 Allergic contact dermatitis due to adhesives: Secondary | ICD-10-CM

## 2017-03-09 LAB — CBC WITH DIFFERENTIAL/PLATELET
BASOS ABS: 78 {cells}/uL (ref 0–200)
Basophils Relative: 1 %
Eosinophils Absolute: 257 cells/uL (ref 15–500)
Eosinophils Relative: 3.3 %
HEMATOCRIT: 39.5 % (ref 35.0–45.0)
Hemoglobin: 13.3 g/dL (ref 11.7–15.5)
LYMPHS ABS: 3409 {cells}/uL (ref 850–3900)
MCH: 29 pg (ref 27.0–33.0)
MCHC: 33.7 g/dL (ref 32.0–36.0)
MCV: 86.2 fL (ref 80.0–100.0)
MPV: 10.5 fL (ref 7.5–12.5)
Monocytes Relative: 5 %
NEUTROS PCT: 47 %
Neutro Abs: 3666 cells/uL (ref 1500–7800)
Platelets: 301 10*3/uL (ref 140–400)
RBC: 4.58 10*6/uL (ref 3.80–5.10)
RDW: 14.5 % (ref 11.0–15.0)
Total Lymphocyte: 43.7 %
WBC: 7.8 10*3/uL (ref 3.8–10.8)
WBCMIX: 390 {cells}/uL (ref 200–950)

## 2017-03-09 NOTE — Progress Notes (Signed)
RFV: follow up for hepatic abscess   Patient ID: Molly Baker, female   DOB: [birth year], 47 y.o.   MRN: [id number]  HPI 47yo F with history of hepatic abscess- polymicrobial with strep viridans and pan sensitive e.coli.  recently treated with Iv abtx which stopped on 1/21 and completed course with 2 wk of cefpodoxime. She has hx of various drug allergies but some difficult to tease out due to having different drug classes at the same time. When we last saw her she also had contact dermatitis to the tape used for her picc line. Initially her hepatic abscess was 11 x 11 cm complex mass with cystic or necrotic areas in the right lobe of the liver with mild surrounding edema. She had percutaneous drainage plus IV abtx. Her repeat CT on 1/10 showed the fluid collection down to 2.3cm, for which we treated with addn 2 wk of oral abtx  She finished her oral abtx without difficulty about a week ago. Energy back to normal. Anxious for what to look for next time. Started back at work full time this week, tolerated 1/2 time last week.  ROS: She has occ night/day sweats- but this has been longstanding. No other fevers, chills, rigors. No abdominal pain. Has mild constipation. 12 point ros is otherwise normal    Outpatient Encounter Medications as of 03/09/2017  Medication Sig  . buPROPion (WELLBUTRIN XL) 300 MG 24 hr tablet Take 300 mg by mouth daily after breakfast.   . cefpodoxime (VANTIN) 200 MG tablet Take 1 tablet (200 mg total) by mouth 2 (two) times daily.  . ertapenem (INVANZ) IVPB Inject 1 g into the vein daily. Indication:  Hepatic abscess Last Day of Therapy:  02/15/17 Labs - Once weekly:  CBC/D and BMP, Labs - Every other week:  ESR and CRP (Patient not taking: Reported on 02/17/2017)  . esomeprazole (NEXIUM) 20 MG capsule Take 40 mg by mouth at bedtime.  . mometasone (ELOCON) 0.1 % ointment Apply topically daily.  . polyethylene glycol (MIRALAX / GLYCOLAX) packet Take 17 g by mouth daily.  (Patient not taking: Reported on 02/04/2017)   No facility-administered encounter medications on file as of 03/09/2017.      Patient Active Problem List   Diagnosis Date Noted  . Fever   . Allergic drug rash   . Anxiety   . 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 History of common bile duct surgery 01/24/2017  . Hepatic abscess 01/23/2017     Health Maintenance Due  Topic Date Due  . TETANUS/TDAP  03/07/1988  . INFLUENZA VACCINE  08/26/2016  . PAP SMEAR  12/28/2016     Physical Exam   BP 130/84   Pulse 91   Temp 98.6 F (37 C) (Oral)   LMP 04/03/2015   Physical Exam  Constitutional:  oriented to person, place, and time. appears well-developed and well-nourished. No distress.  HENT: [place]/AT, PERRLA, no scleral icterus Mouth/Throat: Oropharynx is clear and moist. No oropharyngeal exudate.  Cardiovascular: Normal rate, regular rhythm and normal heart sounds. Exam reveals no gallop and no friction rub.  No murmur heard.  Pulmonary/Chest: Effort normal and breath sounds normal. No respiratory distress.  has no wheezes.  Abdominal: Soft. Bowel sounds are decreased.  exhibits no distension. There is no tenderness.  Neurological: alert and oriented to person, place, and time.  Skin: Skin is warm and dry. No rash noted. No erythema.  Psychiatric: a normal mood and affect.  behavior is normal.    CBC Lab  Results  Component Value Date   WBC 9.9 01/31/2017   RBC 3.71 (L) 01/31/2017   HGB 10.7 (L) 01/31/2017   HCT 32.8 (L) 01/31/2017   PLT 916 (HH) 01/31/2017   MCV 88.4 01/31/2017   MCH 28.8 01/31/2017   MCHC 32.6 01/31/2017   RDW 15.4 01/31/2017   LYMPHSABS 2.6 01/23/2017   MONOABS 1.2 (H) 01/23/2017   EOSABS 0.0 01/23/2017    BMET Lab Results  Component Value Date   NA 137 01/31/2017   K 3.7 01/31/2017   CL 103 01/31/2017   CO2 27 01/31/2017   GLUCOSE 83 01/31/2017   BUN 10 01/31/2017   CREATININE 0.66 01/31/2017   CALCIUM 8.4 (L) 01/31/2017   GFRNONAA >60  01/31/2017   GFRAA >60 01/31/2017    Specimen Description ABSCESS LIVER   Special Requests Normal   Gram Stain ABUNDANT WBC PRESENT,BOTH PMN AND MONONUCLEAR  MODERATE GRAM POSITIVE COCCI IN PAIRS IN CHAINS  RARE GRAM VARIABLE ROD     Culture MODERATE ESCHERICHIA COLI  MODERATE VIRIDANS STREPTOCOCCUS  NO ANAEROBES ISOLATED  Performed at [facility], [place] [address] [place], [place] [address]     Report Status 01/30/2017 FINAL   Organism ID, Bacteria ESCHERICHIA COLI   Resulting Agency [facility]  Susceptibility    Escherichia coli    MIC    AMPICILLIN 4 SENSITIVE  Sensitive    AMPICILLIN/SULBACTAM <=2 SENSITIVE  Sensitive    CEFAZOLIN <=4 SENSITIVE  Sensitive    CEFEPIME <=1 SENSITIVE  Sensitive    CEFTAZIDIME <=1 SENSITIVE  Sensitive    CEFTRIAXONE <=1 SENSITIVE  Sensitive    CIPROFLOXACIN <=0.25 SENS... Sensitive    Extended ESBL NEGATIVE  Sensitive    GENTAMICIN <=1 SENSITIVE  Sensitive    IMIPENEM <=0.25 SENS... Sensitive    PIP/TAZO <=4 SENSITIVE  Sensitive    TRIMETH/SULFA <=20 SENSIT... Sensitive         Susceptibility Comments       Assessment and Plan  Hepatic abscess - finished course of treatment. Will check cbc to see that it has normalized, specifically she had thrombocytosis which could be acute phase reactant to infection. If still markedly elevated would consider repeat ct. For now, not thought to need repeat imaging  Thrombocytosis = will repeat cbc to see if back to normal range  Contact dermatitis - improved with steroids, resolved  Multiple drug allergies = she tolerates cephalosporins which we will note on her extensive allergy list

## 2017-03-11 ENCOUNTER — Encounter: Payer: Self-pay | Admitting: Internal Medicine

## 2017-04-08 ENCOUNTER — Other Ambulatory Visit: Payer: Self-pay | Admitting: Gastroenterology

## 2017-04-08 DIAGNOSIS — K8309 Other cholangitis: Secondary | ICD-10-CM

## 2017-04-09 ENCOUNTER — Ambulatory Visit
Admission: RE | Admit: 2017-04-09 | Discharge: 2017-04-09 | Disposition: A | Discharge status: Home / Self Care | Payer: BLUE CROSS/BLUE SHIELD | Source: Ambulatory Visit | Attending: Gastroenterology | Admitting: Gastroenterology

## 2017-04-09 DIAGNOSIS — R932 Abnormal findings on diagnostic imaging of liver and biliary tract: Secondary | ICD-10-CM | POA: Diagnosis not present

## 2017-04-09 DIAGNOSIS — K8309 Other cholangitis: Secondary | ICD-10-CM

## 2017-04-09 DIAGNOSIS — K760 Fatty (change of) liver, not elsewhere classified: Secondary | ICD-10-CM | POA: Diagnosis not present

## 2017-04-09 MED ORDER — GADOBENATE DIMEGLUMINE 529 MG/ML IV SOLN
20.0000 mL | Freq: Once | INTRAVENOUS | Status: AC | PRN
  Administered 2017-04-09: 20 mL via INTRAVENOUS

## 2017-04-19 DIAGNOSIS — Z0000 Encounter for general adult medical examination without abnormal findings: Secondary | ICD-10-CM | POA: Diagnosis not present

## 2017-05-03 DIAGNOSIS — Z9889 Other specified postprocedural states: Secondary | ICD-10-CM | POA: Diagnosis not present

## 2017-05-03 DIAGNOSIS — K839 Disease of biliary tract, unspecified: Secondary | ICD-10-CM | POA: Diagnosis not present

## 2017-05-03 DIAGNOSIS — K750 Abscess of liver: Secondary | ICD-10-CM | POA: Diagnosis not present

## 2017-05-13 DIAGNOSIS — K839 Disease of biliary tract, unspecified: Secondary | ICD-10-CM | POA: Diagnosis not present

## 2017-05-13 DIAGNOSIS — L918 Other hypertrophic disorders of the skin: Secondary | ICD-10-CM | POA: Diagnosis not present

## 2017-05-13 DIAGNOSIS — J45990 Exercise induced bronchospasm: Secondary | ICD-10-CM | POA: Diagnosis not present

## 2017-05-13 DIAGNOSIS — K750 Abscess of liver: Secondary | ICD-10-CM | POA: Diagnosis not present

## 2017-05-13 DIAGNOSIS — Z9889 Other specified postprocedural states: Secondary | ICD-10-CM | POA: Diagnosis not present

## 2017-05-17 DIAGNOSIS — L0291 Cutaneous abscess, unspecified: Secondary | ICD-10-CM | POA: Diagnosis not present

## 2017-05-31 DIAGNOSIS — K750 Abscess of liver: Secondary | ICD-10-CM | POA: Diagnosis not present

## 2017-05-31 DIAGNOSIS — K831 Obstruction of bile duct: Secondary | ICD-10-CM | POA: Diagnosis not present

## 2017-05-31 DIAGNOSIS — K9181 Other intraoperative complications of digestive system: Secondary | ICD-10-CM | POA: Diagnosis not present

## 2017-06-18 DIAGNOSIS — K750 Abscess of liver: Secondary | ICD-10-CM | POA: Diagnosis not present

## 2017-06-18 DIAGNOSIS — K838 Other specified diseases of biliary tract: Secondary | ICD-10-CM | POA: Diagnosis not present

## 2017-06-18 DIAGNOSIS — K831 Obstruction of bile duct: Secondary | ICD-10-CM | POA: Diagnosis not present

## 2017-06-24 DIAGNOSIS — Z880 Allergy status to penicillin: Secondary | ICD-10-CM | POA: Diagnosis not present

## 2017-06-24 DIAGNOSIS — Z8659 Personal history of other mental and behavioral disorders: Secondary | ICD-10-CM | POA: Diagnosis not present

## 2017-06-24 DIAGNOSIS — K831 Obstruction of bile duct: Secondary | ICD-10-CM | POA: Diagnosis not present

## 2017-06-24 DIAGNOSIS — D72829 Elevated white blood cell count, unspecified: Secondary | ICD-10-CM | POA: Diagnosis not present

## 2017-06-24 DIAGNOSIS — Z881 Allergy status to other antibiotic agents status: Secondary | ICD-10-CM | POA: Diagnosis not present

## 2017-06-24 DIAGNOSIS — I959 Hypotension, unspecified: Secondary | ICD-10-CM | POA: Diagnosis not present

## 2017-06-24 DIAGNOSIS — Z90710 Acquired absence of both cervix and uterus: Secondary | ICD-10-CM | POA: Diagnosis not present

## 2017-06-24 DIAGNOSIS — R1013 Epigastric pain: Secondary | ICD-10-CM | POA: Diagnosis not present

## 2017-06-24 DIAGNOSIS — K750 Abscess of liver: Secondary | ICD-10-CM | POA: Diagnosis not present

## 2017-06-24 DIAGNOSIS — K838 Other specified diseases of biliary tract: Secondary | ICD-10-CM | POA: Diagnosis not present

## 2017-06-24 DIAGNOSIS — Z79899 Other long term (current) drug therapy: Secondary | ICD-10-CM | POA: Diagnosis not present

## 2017-06-24 DIAGNOSIS — R52 Pain, unspecified: Secondary | ICD-10-CM | POA: Diagnosis not present

## 2017-06-24 DIAGNOSIS — T380X5A Adverse effect of glucocorticoids and synthetic analogues, initial encounter: Secondary | ICD-10-CM | POA: Diagnosis not present

## 2017-06-24 DIAGNOSIS — Z9049 Acquired absence of other specified parts of digestive tract: Secondary | ICD-10-CM | POA: Diagnosis not present

## 2017-06-24 DIAGNOSIS — G8918 Other acute postprocedural pain: Secondary | ICD-10-CM | POA: Diagnosis not present

## 2017-06-24 DIAGNOSIS — R0902 Hypoxemia: Secondary | ICD-10-CM | POA: Diagnosis not present

## 2017-06-24 DIAGNOSIS — Z885 Allergy status to narcotic agent status: Secondary | ICD-10-CM | POA: Diagnosis not present

## 2017-06-24 DIAGNOSIS — F329 Major depressive disorder, single episode, unspecified: Secondary | ICD-10-CM | POA: Diagnosis not present

## 2017-06-24 DIAGNOSIS — K9189 Other postprocedural complications and disorders of digestive system: Secondary | ICD-10-CM | POA: Diagnosis not present

## 2017-06-24 DIAGNOSIS — K219 Gastro-esophageal reflux disease without esophagitis: Secondary | ICD-10-CM | POA: Diagnosis not present

## 2017-06-24 DIAGNOSIS — Z91041 Radiographic dye allergy status: Secondary | ICD-10-CM | POA: Diagnosis not present

## 2017-06-25 DIAGNOSIS — K838 Other specified diseases of biliary tract: Secondary | ICD-10-CM | POA: Diagnosis not present

## 2017-06-25 DIAGNOSIS — R1013 Epigastric pain: Secondary | ICD-10-CM | POA: Diagnosis not present

## 2017-06-25 DIAGNOSIS — K750 Abscess of liver: Secondary | ICD-10-CM | POA: Diagnosis not present

## 2017-06-25 DIAGNOSIS — K831 Obstruction of bile duct: Secondary | ICD-10-CM | POA: Diagnosis not present

## 2017-06-26 DIAGNOSIS — K750 Abscess of liver: Secondary | ICD-10-CM | POA: Diagnosis not present

## 2017-06-26 DIAGNOSIS — K831 Obstruction of bile duct: Secondary | ICD-10-CM | POA: Diagnosis not present

## 2017-06-26 DIAGNOSIS — K838 Other specified diseases of biliary tract: Secondary | ICD-10-CM | POA: Diagnosis not present

## 2017-06-27 DIAGNOSIS — K838 Other specified diseases of biliary tract: Secondary | ICD-10-CM | POA: Diagnosis not present

## 2017-06-27 DIAGNOSIS — R52 Pain, unspecified: Secondary | ICD-10-CM | POA: Diagnosis not present

## 2017-06-27 DIAGNOSIS — K831 Obstruction of bile duct: Secondary | ICD-10-CM | POA: Diagnosis not present

## 2017-06-27 DIAGNOSIS — K750 Abscess of liver: Secondary | ICD-10-CM | POA: Diagnosis not present

## 2017-07-14 DIAGNOSIS — J029 Acute pharyngitis, unspecified: Secondary | ICD-10-CM | POA: Diagnosis not present

## 2017-07-14 DIAGNOSIS — K1379 Other lesions of oral mucosa: Secondary | ICD-10-CM | POA: Diagnosis not present

## 2017-07-26 ENCOUNTER — Other Ambulatory Visit: Payer: Self-pay | Admitting: Family Medicine

## 2017-07-26 DIAGNOSIS — Z1231 Encounter for screening mammogram for malignant neoplasm of breast: Secondary | ICD-10-CM

## 2017-08-04 ENCOUNTER — Other Ambulatory Visit: Payer: Self-pay | Admitting: Family Medicine

## 2017-08-04 ENCOUNTER — Other Ambulatory Visit (HOSPITAL_COMMUNITY)
Admission: RE | Admit: 2017-08-04 | Discharge: 2017-08-04 | Disposition: A | Discharge status: Home / Self Care | Payer: BLUE CROSS/BLUE SHIELD | Source: Ambulatory Visit | Attending: Family Medicine | Admitting: Family Medicine

## 2017-08-04 DIAGNOSIS — Z124 Encounter for screening for malignant neoplasm of cervix: Secondary | ICD-10-CM | POA: Insufficient documentation

## 2017-08-04 DIAGNOSIS — Z0000 Encounter for general adult medical examination without abnormal findings: Secondary | ICD-10-CM | POA: Diagnosis not present

## 2017-08-04 DIAGNOSIS — K750 Abscess of liver: Secondary | ICD-10-CM | POA: Diagnosis not present

## 2017-08-04 DIAGNOSIS — J45990 Exercise induced bronchospasm: Secondary | ICD-10-CM | POA: Diagnosis not present

## 2017-08-04 DIAGNOSIS — K839 Disease of biliary tract, unspecified: Secondary | ICD-10-CM | POA: Diagnosis not present

## 2017-08-05 LAB — CYTOLOGY - PAP
Diagnosis: NEGATIVE
HPV (WINDOPATH): NOT DETECTED

## 2017-08-06 DIAGNOSIS — T82858A Stenosis of vascular prosthetic devices, implants and grafts, initial encounter: Secondary | ICD-10-CM | POA: Diagnosis not present

## 2017-08-06 DIAGNOSIS — K831 Obstruction of bile duct: Secondary | ICD-10-CM | POA: Diagnosis not present

## 2017-08-10 ENCOUNTER — Ambulatory Visit
Admission: RE | Admit: 2017-08-10 | Discharge: 2017-08-10 | Disposition: A | Discharge status: Home / Self Care | Payer: BLUE CROSS/BLUE SHIELD | Source: Ambulatory Visit | Attending: Family Medicine | Admitting: Family Medicine

## 2017-08-10 DIAGNOSIS — Z1231 Encounter for screening mammogram for malignant neoplasm of breast: Secondary | ICD-10-CM | POA: Diagnosis not present

## 2017-08-23 DIAGNOSIS — K831 Obstruction of bile duct: Secondary | ICD-10-CM | POA: Diagnosis not present

## 2017-08-23 DIAGNOSIS — K750 Abscess of liver: Secondary | ICD-10-CM | POA: Diagnosis not present

## 2017-09-03 DIAGNOSIS — K9189 Other postprocedural complications and disorders of digestive system: Secondary | ICD-10-CM | POA: Diagnosis not present

## 2017-09-03 DIAGNOSIS — K831 Obstruction of bile duct: Secondary | ICD-10-CM | POA: Diagnosis not present

## 2017-11-05 DIAGNOSIS — K831 Obstruction of bile duct: Secondary | ICD-10-CM | POA: Diagnosis not present

## 2017-11-05 DIAGNOSIS — K750 Abscess of liver: Secondary | ICD-10-CM | POA: Diagnosis not present

## 2017-12-13 DIAGNOSIS — Z934 Other artificial openings of gastrointestinal tract status: Secondary | ICD-10-CM | POA: Diagnosis not present

## 2017-12-13 DIAGNOSIS — K838 Other specified diseases of biliary tract: Secondary | ICD-10-CM | POA: Diagnosis not present

## 2017-12-13 DIAGNOSIS — Z9049 Acquired absence of other specified parts of digestive tract: Secondary | ICD-10-CM | POA: Diagnosis not present

## 2017-12-13 DIAGNOSIS — K831 Obstruction of bile duct: Secondary | ICD-10-CM | POA: Diagnosis not present

## 2017-12-21 DIAGNOSIS — K750 Abscess of liver: Secondary | ICD-10-CM | POA: Diagnosis not present

## 2017-12-21 DIAGNOSIS — T85520A Displacement of bile duct prosthesis, initial encounter: Secondary | ICD-10-CM | POA: Diagnosis not present

## 2017-12-21 DIAGNOSIS — K831 Obstruction of bile duct: Secondary | ICD-10-CM | POA: Diagnosis not present

## 2017-12-21 DIAGNOSIS — Z9049 Acquired absence of other specified parts of digestive tract: Secondary | ICD-10-CM | POA: Diagnosis not present

## 2017-12-22 DIAGNOSIS — Y831 Surgical operation with implant of artificial internal device as the cause of abnormal reaction of the patient, or of later complication, without mention of misadventure at the time of the procedure: Secondary | ICD-10-CM | POA: Diagnosis not present

## 2017-12-22 DIAGNOSIS — Z9049 Acquired absence of other specified parts of digestive tract: Secondary | ICD-10-CM | POA: Diagnosis not present

## 2017-12-22 DIAGNOSIS — K750 Abscess of liver: Secondary | ICD-10-CM | POA: Diagnosis not present

## 2017-12-22 DIAGNOSIS — K831 Obstruction of bile duct: Secondary | ICD-10-CM | POA: Diagnosis not present

## 2017-12-22 DIAGNOSIS — T85520A Displacement of bile duct prosthesis, initial encounter: Secondary | ICD-10-CM | POA: Diagnosis not present

## 2018-01-14 DIAGNOSIS — K838 Other specified diseases of biliary tract: Secondary | ICD-10-CM | POA: Diagnosis not present

## 2018-01-14 DIAGNOSIS — Z8719 Personal history of other diseases of the digestive system: Secondary | ICD-10-CM | POA: Diagnosis not present

## 2018-01-14 DIAGNOSIS — R935 Abnormal findings on diagnostic imaging of other abdominal regions, including retroperitoneum: Secondary | ICD-10-CM | POA: Diagnosis not present

## 2018-01-14 DIAGNOSIS — K831 Obstruction of bile duct: Secondary | ICD-10-CM | POA: Diagnosis not present

## 2018-01-14 DIAGNOSIS — Z934 Other artificial openings of gastrointestinal tract status: Secondary | ICD-10-CM | POA: Diagnosis not present

## 2018-02-07 DIAGNOSIS — R109 Unspecified abdominal pain: Secondary | ICD-10-CM | POA: Diagnosis not present

## 2018-02-07 DIAGNOSIS — J90 Pleural effusion, not elsewhere classified: Secondary | ICD-10-CM | POA: Diagnosis not present

## 2018-02-07 DIAGNOSIS — K760 Fatty (change of) liver, not elsewhere classified: Secondary | ICD-10-CM | POA: Diagnosis not present

## 2018-02-07 DIAGNOSIS — K660 Peritoneal adhesions (postprocedural) (postinfection): Secondary | ICD-10-CM | POA: Diagnosis not present

## 2018-02-07 DIAGNOSIS — K831 Obstruction of bile duct: Secondary | ICD-10-CM | POA: Diagnosis not present

## 2018-02-07 DIAGNOSIS — G8918 Other acute postprocedural pain: Secondary | ICD-10-CM | POA: Diagnosis not present

## 2018-02-07 DIAGNOSIS — J45909 Unspecified asthma, uncomplicated: Secondary | ICD-10-CM | POA: Diagnosis not present

## 2018-02-07 DIAGNOSIS — B178 Other specified acute viral hepatitis: Secondary | ICD-10-CM | POA: Diagnosis not present

## 2018-02-07 DIAGNOSIS — K740 Hepatic fibrosis: Secondary | ICD-10-CM | POA: Diagnosis not present

## 2018-02-08 DIAGNOSIS — G8918 Other acute postprocedural pain: Secondary | ICD-10-CM | POA: Diagnosis not present

## 2018-02-08 DIAGNOSIS — K831 Obstruction of bile duct: Secondary | ICD-10-CM | POA: Diagnosis not present

## 2018-02-09 DIAGNOSIS — R109 Unspecified abdominal pain: Secondary | ICD-10-CM | POA: Diagnosis not present

## 2018-02-09 DIAGNOSIS — J90 Pleural effusion, not elsewhere classified: Secondary | ICD-10-CM | POA: Diagnosis not present

## 2018-02-09 DIAGNOSIS — G8918 Other acute postprocedural pain: Secondary | ICD-10-CM | POA: Diagnosis not present

## 2018-02-10 DIAGNOSIS — G8918 Other acute postprocedural pain: Secondary | ICD-10-CM | POA: Diagnosis not present

## 2018-02-11 DIAGNOSIS — G8918 Other acute postprocedural pain: Secondary | ICD-10-CM | POA: Diagnosis not present

## 2018-02-12 DIAGNOSIS — G8918 Other acute postprocedural pain: Secondary | ICD-10-CM | POA: Diagnosis not present

## 2018-02-23 DIAGNOSIS — K750 Abscess of liver: Secondary | ICD-10-CM | POA: Diagnosis not present

## 2018-02-25 DIAGNOSIS — A499 Bacterial infection, unspecified: Secondary | ICD-10-CM | POA: Diagnosis not present

## 2018-02-25 DIAGNOSIS — Z885 Allergy status to narcotic agent status: Secondary | ICD-10-CM | POA: Diagnosis not present

## 2018-02-25 DIAGNOSIS — K750 Abscess of liver: Secondary | ICD-10-CM | POA: Diagnosis not present

## 2018-02-25 DIAGNOSIS — E440 Moderate protein-calorie malnutrition: Secondary | ICD-10-CM | POA: Diagnosis not present

## 2018-02-25 DIAGNOSIS — Z9049 Acquired absence of other specified parts of digestive tract: Secondary | ICD-10-CM | POA: Diagnosis not present

## 2018-02-25 DIAGNOSIS — R188 Other ascites: Secondary | ICD-10-CM | POA: Diagnosis not present

## 2018-02-25 DIAGNOSIS — K7581 Nonalcoholic steatohepatitis (NASH): Secondary | ICD-10-CM | POA: Diagnosis not present

## 2018-02-25 DIAGNOSIS — K831 Obstruction of bile duct: Secondary | ICD-10-CM | POA: Diagnosis not present

## 2018-02-25 DIAGNOSIS — T8143XA Infection following a procedure, organ and space surgical site, initial encounter: Secondary | ICD-10-CM | POA: Diagnosis not present

## 2018-02-25 DIAGNOSIS — Z91041 Radiographic dye allergy status: Secondary | ICD-10-CM | POA: Diagnosis not present

## 2018-02-25 DIAGNOSIS — Z79899 Other long term (current) drug therapy: Secondary | ICD-10-CM | POA: Diagnosis not present

## 2018-02-25 DIAGNOSIS — Z6833 Body mass index (BMI) 33.0-33.9, adult: Secondary | ICD-10-CM | POA: Diagnosis not present

## 2018-02-25 DIAGNOSIS — Z880 Allergy status to penicillin: Secondary | ICD-10-CM | POA: Diagnosis not present

## 2018-02-25 DIAGNOSIS — Z881 Allergy status to other antibiotic agents status: Secondary | ICD-10-CM | POA: Diagnosis not present

## 2018-02-25 DIAGNOSIS — D72829 Elevated white blood cell count, unspecified: Secondary | ICD-10-CM | POA: Diagnosis not present

## 2018-02-25 DIAGNOSIS — J45909 Unspecified asthma, uncomplicated: Secondary | ICD-10-CM | POA: Diagnosis not present

## 2018-02-26 DIAGNOSIS — R188 Other ascites: Secondary | ICD-10-CM | POA: Diagnosis not present

## 2018-02-26 DIAGNOSIS — K831 Obstruction of bile duct: Secondary | ICD-10-CM | POA: Diagnosis not present

## 2018-02-28 DIAGNOSIS — K750 Abscess of liver: Secondary | ICD-10-CM | POA: Diagnosis not present

## 2018-02-28 DIAGNOSIS — K831 Obstruction of bile duct: Secondary | ICD-10-CM | POA: Diagnosis not present

## 2018-03-01 DIAGNOSIS — A499 Bacterial infection, unspecified: Secondary | ICD-10-CM | POA: Diagnosis not present

## 2018-03-01 DIAGNOSIS — K750 Abscess of liver: Secondary | ICD-10-CM | POA: Diagnosis not present

## 2018-03-01 DIAGNOSIS — K831 Obstruction of bile duct: Secondary | ICD-10-CM | POA: Diagnosis not present

## 2018-03-02 DIAGNOSIS — K831 Obstruction of bile duct: Secondary | ICD-10-CM | POA: Diagnosis not present

## 2018-03-02 DIAGNOSIS — K750 Abscess of liver: Secondary | ICD-10-CM | POA: Diagnosis not present

## 2018-03-03 DIAGNOSIS — A499 Bacterial infection, unspecified: Secondary | ICD-10-CM | POA: Diagnosis not present

## 2018-03-03 DIAGNOSIS — K750 Abscess of liver: Secondary | ICD-10-CM | POA: Diagnosis not present

## 2018-03-03 DIAGNOSIS — K831 Obstruction of bile duct: Secondary | ICD-10-CM | POA: Diagnosis not present

## 2018-03-04 DIAGNOSIS — K750 Abscess of liver: Secondary | ICD-10-CM | POA: Diagnosis not present

## 2018-03-04 DIAGNOSIS — K831 Obstruction of bile duct: Secondary | ICD-10-CM | POA: Diagnosis not present

## 2018-03-05 DIAGNOSIS — K750 Abscess of liver: Secondary | ICD-10-CM | POA: Diagnosis not present

## 2018-03-06 DIAGNOSIS — K750 Abscess of liver: Secondary | ICD-10-CM | POA: Diagnosis not present

## 2018-03-07 DIAGNOSIS — K750 Abscess of liver: Secondary | ICD-10-CM | POA: Diagnosis not present

## 2018-03-12 DIAGNOSIS — K750 Abscess of liver: Secondary | ICD-10-CM | POA: Diagnosis not present

## 2018-03-14 DIAGNOSIS — K750 Abscess of liver: Secondary | ICD-10-CM | POA: Diagnosis not present

## 2018-03-17 DIAGNOSIS — Z452 Encounter for adjustment and management of vascular access device: Secondary | ICD-10-CM | POA: Diagnosis not present

## 2018-03-17 DIAGNOSIS — R109 Unspecified abdominal pain: Secondary | ICD-10-CM | POA: Diagnosis not present

## 2018-03-17 DIAGNOSIS — K750 Abscess of liver: Secondary | ICD-10-CM | POA: Diagnosis not present

## 2018-03-17 DIAGNOSIS — J9811 Atelectasis: Secondary | ICD-10-CM | POA: Diagnosis not present

## 2018-03-18 DIAGNOSIS — K750 Abscess of liver: Secondary | ICD-10-CM | POA: Diagnosis not present

## 2018-03-21 DIAGNOSIS — K750 Abscess of liver: Secondary | ICD-10-CM | POA: Diagnosis not present

## 2018-03-24 DIAGNOSIS — K750 Abscess of liver: Secondary | ICD-10-CM | POA: Diagnosis not present

## 2018-03-28 DIAGNOSIS — K750 Abscess of liver: Secondary | ICD-10-CM | POA: Diagnosis not present

## 2018-03-30 DIAGNOSIS — Z95828 Presence of other vascular implants and grafts: Secondary | ICD-10-CM | POA: Diagnosis not present

## 2018-03-30 DIAGNOSIS — Z9049 Acquired absence of other specified parts of digestive tract: Secondary | ICD-10-CM | POA: Diagnosis not present

## 2018-03-30 DIAGNOSIS — K831 Obstruction of bile duct: Secondary | ICD-10-CM | POA: Diagnosis not present

## 2018-03-30 DIAGNOSIS — Z452 Encounter for adjustment and management of vascular access device: Secondary | ICD-10-CM | POA: Diagnosis not present

## 2018-03-30 DIAGNOSIS — Z79899 Other long term (current) drug therapy: Secondary | ICD-10-CM | POA: Diagnosis not present

## 2018-03-30 DIAGNOSIS — K632 Fistula of intestine: Secondary | ICD-10-CM | POA: Diagnosis not present

## 2018-03-30 DIAGNOSIS — K838 Other specified diseases of biliary tract: Secondary | ICD-10-CM | POA: Diagnosis not present

## 2018-03-30 DIAGNOSIS — K750 Abscess of liver: Secondary | ICD-10-CM | POA: Diagnosis not present

## 2018-03-30 DIAGNOSIS — R109 Unspecified abdominal pain: Secondary | ICD-10-CM | POA: Diagnosis not present

## 2018-03-30 DIAGNOSIS — K833 Fistula of bile duct: Secondary | ICD-10-CM | POA: Diagnosis not present

## 2018-03-31 DIAGNOSIS — Z9049 Acquired absence of other specified parts of digestive tract: Secondary | ICD-10-CM | POA: Diagnosis not present

## 2018-03-31 DIAGNOSIS — K833 Fistula of bile duct: Secondary | ICD-10-CM | POA: Diagnosis not present

## 2018-03-31 DIAGNOSIS — K750 Abscess of liver: Secondary | ICD-10-CM | POA: Diagnosis not present

## 2018-03-31 DIAGNOSIS — Z95828 Presence of other vascular implants and grafts: Secondary | ICD-10-CM | POA: Diagnosis not present

## 2018-03-31 DIAGNOSIS — Z79899 Other long term (current) drug therapy: Secondary | ICD-10-CM | POA: Diagnosis not present

## 2018-03-31 DIAGNOSIS — K632 Fistula of intestine: Secondary | ICD-10-CM | POA: Diagnosis not present

## 2018-03-31 DIAGNOSIS — K831 Obstruction of bile duct: Secondary | ICD-10-CM | POA: Diagnosis not present

## 2018-03-31 DIAGNOSIS — Z452 Encounter for adjustment and management of vascular access device: Secondary | ICD-10-CM | POA: Diagnosis not present

## 2018-04-03 DIAGNOSIS — K750 Abscess of liver: Secondary | ICD-10-CM | POA: Diagnosis not present

## 2018-04-04 DIAGNOSIS — K750 Abscess of liver: Secondary | ICD-10-CM | POA: Diagnosis not present

## 2018-04-06 DIAGNOSIS — K750 Abscess of liver: Secondary | ICD-10-CM | POA: Diagnosis not present

## 2018-04-10 DIAGNOSIS — K750 Abscess of liver: Secondary | ICD-10-CM | POA: Diagnosis not present

## 2018-04-11 DIAGNOSIS — K750 Abscess of liver: Secondary | ICD-10-CM | POA: Diagnosis not present

## 2018-04-15 DIAGNOSIS — K750 Abscess of liver: Secondary | ICD-10-CM | POA: Diagnosis not present

## 2018-04-17 DIAGNOSIS — K750 Abscess of liver: Secondary | ICD-10-CM | POA: Diagnosis not present

## 2018-04-18 DIAGNOSIS — K750 Abscess of liver: Secondary | ICD-10-CM | POA: Diagnosis not present

## 2018-04-21 DIAGNOSIS — K760 Fatty (change of) liver, not elsewhere classified: Secondary | ICD-10-CM | POA: Diagnosis not present

## 2018-04-21 DIAGNOSIS — K833 Fistula of bile duct: Secondary | ICD-10-CM | POA: Diagnosis not present

## 2018-04-21 DIAGNOSIS — K750 Abscess of liver: Secondary | ICD-10-CM | POA: Diagnosis not present

## 2018-04-21 DIAGNOSIS — K831 Obstruction of bile duct: Secondary | ICD-10-CM | POA: Diagnosis not present

## 2018-04-23 DIAGNOSIS — K750 Abscess of liver: Secondary | ICD-10-CM | POA: Diagnosis not present

## 2018-04-25 DIAGNOSIS — K750 Abscess of liver: Secondary | ICD-10-CM | POA: Diagnosis not present

## 2018-04-29 DIAGNOSIS — K750 Abscess of liver: Secondary | ICD-10-CM | POA: Diagnosis not present

## 2018-05-02 DIAGNOSIS — K750 Abscess of liver: Secondary | ICD-10-CM | POA: Diagnosis not present

## 2018-05-20 DIAGNOSIS — K750 Abscess of liver: Secondary | ICD-10-CM | POA: Diagnosis not present

## 2018-05-20 DIAGNOSIS — K831 Obstruction of bile duct: Secondary | ICD-10-CM | POA: Diagnosis not present

## 2018-05-20 DIAGNOSIS — Z4682 Encounter for fitting and adjustment of non-vascular catheter: Secondary | ICD-10-CM | POA: Diagnosis not present

## 2018-05-20 DIAGNOSIS — K838 Other specified diseases of biliary tract: Secondary | ICD-10-CM | POA: Diagnosis not present

## 2018-06-23 DIAGNOSIS — K750 Abscess of liver: Secondary | ICD-10-CM | POA: Diagnosis not present

## 2018-07-15 DIAGNOSIS — K750 Abscess of liver: Secondary | ICD-10-CM | POA: Diagnosis not present

## 2018-07-15 DIAGNOSIS — K831 Obstruction of bile duct: Secondary | ICD-10-CM | POA: Diagnosis not present

## 2018-07-30 DIAGNOSIS — Z03818 Encounter for observation for suspected exposure to other biological agents ruled out: Secondary | ICD-10-CM | POA: Diagnosis not present

## 2018-08-02 DIAGNOSIS — R509 Fever, unspecified: Secondary | ICD-10-CM | POA: Diagnosis not present

## 2018-08-02 DIAGNOSIS — K750 Abscess of liver: Secondary | ICD-10-CM | POA: Diagnosis not present

## 2018-08-04 DIAGNOSIS — L02211 Cutaneous abscess of abdominal wall: Secondary | ICD-10-CM | POA: Diagnosis not present

## 2018-08-04 DIAGNOSIS — R188 Other ascites: Secondary | ICD-10-CM | POA: Diagnosis not present

## 2018-08-04 DIAGNOSIS — D62 Acute posthemorrhagic anemia: Secondary | ICD-10-CM | POA: Diagnosis not present

## 2018-08-04 DIAGNOSIS — K831 Obstruction of bile duct: Secondary | ICD-10-CM | POA: Diagnosis not present

## 2018-08-04 DIAGNOSIS — A408 Other streptococcal sepsis: Secondary | ICD-10-CM | POA: Diagnosis not present

## 2018-08-04 DIAGNOSIS — I9581 Postprocedural hypotension: Secondary | ICD-10-CM | POA: Diagnosis not present

## 2018-08-04 DIAGNOSIS — K7581 Nonalcoholic steatohepatitis (NASH): Secondary | ICD-10-CM | POA: Diagnosis not present

## 2018-08-04 DIAGNOSIS — Z452 Encounter for adjustment and management of vascular access device: Secondary | ICD-10-CM | POA: Diagnosis not present

## 2018-08-04 DIAGNOSIS — R6521 Severe sepsis with septic shock: Secondary | ICD-10-CM | POA: Diagnosis not present

## 2018-08-04 DIAGNOSIS — R7881 Bacteremia: Secondary | ICD-10-CM | POA: Diagnosis not present

## 2018-08-04 DIAGNOSIS — A419 Sepsis, unspecified organism: Secondary | ICD-10-CM | POA: Diagnosis not present

## 2018-08-04 DIAGNOSIS — K750 Abscess of liver: Secondary | ICD-10-CM | POA: Diagnosis not present

## 2018-08-04 DIAGNOSIS — Z79899 Other long term (current) drug therapy: Secondary | ICD-10-CM | POA: Diagnosis not present

## 2018-08-04 DIAGNOSIS — J90 Pleural effusion, not elsewhere classified: Secondary | ICD-10-CM | POA: Diagnosis not present

## 2018-08-04 DIAGNOSIS — T8144XA Sepsis following a procedure, initial encounter: Secondary | ICD-10-CM | POA: Diagnosis not present

## 2018-08-04 DIAGNOSIS — Z9884 Bariatric surgery status: Secondary | ICD-10-CM | POA: Diagnosis not present

## 2018-08-04 DIAGNOSIS — Z1159 Encounter for screening for other viral diseases: Secondary | ICD-10-CM | POA: Diagnosis not present

## 2018-08-04 DIAGNOSIS — E872 Acidosis: Secondary | ICD-10-CM | POA: Diagnosis not present

## 2018-08-05 DIAGNOSIS — K750 Abscess of liver: Secondary | ICD-10-CM | POA: Diagnosis not present

## 2018-08-05 DIAGNOSIS — I9581 Postprocedural hypotension: Secondary | ICD-10-CM | POA: Diagnosis not present

## 2018-08-05 DIAGNOSIS — Z452 Encounter for adjustment and management of vascular access device: Secondary | ICD-10-CM | POA: Diagnosis not present

## 2018-08-11 DIAGNOSIS — K750 Abscess of liver: Secondary | ICD-10-CM | POA: Diagnosis not present

## 2018-08-12 DIAGNOSIS — K750 Abscess of liver: Secondary | ICD-10-CM | POA: Diagnosis not present

## 2018-08-15 DIAGNOSIS — K750 Abscess of liver: Secondary | ICD-10-CM | POA: Diagnosis not present

## 2018-08-18 DIAGNOSIS — K750 Abscess of liver: Secondary | ICD-10-CM | POA: Diagnosis not present

## 2018-08-22 DIAGNOSIS — K750 Abscess of liver: Secondary | ICD-10-CM | POA: Diagnosis not present

## 2018-08-24 DIAGNOSIS — K750 Abscess of liver: Secondary | ICD-10-CM | POA: Diagnosis not present

## 2018-08-25 DIAGNOSIS — Z01818 Encounter for other preprocedural examination: Secondary | ICD-10-CM | POA: Diagnosis not present

## 2018-08-25 DIAGNOSIS — K831 Obstruction of bile duct: Secondary | ICD-10-CM | POA: Diagnosis not present

## 2018-08-25 DIAGNOSIS — K750 Abscess of liver: Secondary | ICD-10-CM | POA: Diagnosis not present

## 2018-08-28 DIAGNOSIS — K750 Abscess of liver: Secondary | ICD-10-CM | POA: Diagnosis not present

## 2018-08-29 DIAGNOSIS — Z1159 Encounter for screening for other viral diseases: Secondary | ICD-10-CM | POA: Diagnosis not present

## 2018-08-29 DIAGNOSIS — K750 Abscess of liver: Secondary | ICD-10-CM | POA: Diagnosis not present

## 2018-08-30 DIAGNOSIS — Z1159 Encounter for screening for other viral diseases: Secondary | ICD-10-CM | POA: Diagnosis not present

## 2018-09-02 DIAGNOSIS — K750 Abscess of liver: Secondary | ICD-10-CM | POA: Diagnosis not present

## 2018-09-05 DIAGNOSIS — K750 Abscess of liver: Secondary | ICD-10-CM | POA: Diagnosis not present

## 2018-09-08 DIAGNOSIS — K750 Abscess of liver: Secondary | ICD-10-CM | POA: Diagnosis not present

## 2018-09-13 DIAGNOSIS — K750 Abscess of liver: Secondary | ICD-10-CM | POA: Diagnosis not present

## 2018-09-14 DIAGNOSIS — K750 Abscess of liver: Secondary | ICD-10-CM | POA: Diagnosis not present

## 2018-09-17 DIAGNOSIS — K750 Abscess of liver: Secondary | ICD-10-CM | POA: Diagnosis not present

## 2018-09-19 DIAGNOSIS — K750 Abscess of liver: Secondary | ICD-10-CM | POA: Diagnosis not present

## 2018-09-20 DIAGNOSIS — K750 Abscess of liver: Secondary | ICD-10-CM | POA: Diagnosis not present

## 2018-09-20 DIAGNOSIS — Z0189 Encounter for other specified special examinations: Secondary | ICD-10-CM | POA: Diagnosis not present

## 2018-09-23 DIAGNOSIS — K750 Abscess of liver: Secondary | ICD-10-CM | POA: Diagnosis not present

## 2018-09-26 DIAGNOSIS — K9189 Other postprocedural complications and disorders of digestive system: Secondary | ICD-10-CM | POA: Diagnosis not present

## 2018-09-26 DIAGNOSIS — Z7951 Long term (current) use of inhaled steroids: Secondary | ICD-10-CM | POA: Diagnosis not present

## 2018-09-26 DIAGNOSIS — K632 Fistula of intestine: Secondary | ICD-10-CM | POA: Diagnosis not present

## 2018-09-26 DIAGNOSIS — Z880 Allergy status to penicillin: Secondary | ICD-10-CM | POA: Diagnosis not present

## 2018-09-26 DIAGNOSIS — Z888 Allergy status to other drugs, medicaments and biological substances status: Secondary | ICD-10-CM | POA: Diagnosis not present

## 2018-09-26 DIAGNOSIS — Z885 Allergy status to narcotic agent status: Secondary | ICD-10-CM | POA: Diagnosis not present

## 2018-09-26 DIAGNOSIS — K750 Abscess of liver: Secondary | ICD-10-CM | POA: Diagnosis not present

## 2018-09-26 DIAGNOSIS — K316 Fistula of stomach and duodenum: Secondary | ICD-10-CM | POA: Diagnosis not present

## 2018-09-26 DIAGNOSIS — Q399 Congenital malformation of esophagus, unspecified: Secondary | ICD-10-CM | POA: Diagnosis not present

## 2018-09-26 DIAGNOSIS — K831 Obstruction of bile duct: Secondary | ICD-10-CM | POA: Diagnosis not present

## 2018-09-26 DIAGNOSIS — T8183XA Persistent postprocedural fistula, initial encounter: Secondary | ICD-10-CM | POA: Diagnosis not present

## 2018-09-26 DIAGNOSIS — Z79899 Other long term (current) drug therapy: Secondary | ICD-10-CM | POA: Diagnosis not present

## 2018-09-26 DIAGNOSIS — J45909 Unspecified asthma, uncomplicated: Secondary | ICD-10-CM | POA: Diagnosis not present

## 2018-09-26 DIAGNOSIS — Z791 Long term (current) use of non-steroidal anti-inflammatories (NSAID): Secondary | ICD-10-CM | POA: Diagnosis not present

## 2018-09-26 DIAGNOSIS — T182XXA Foreign body in stomach, initial encounter: Secondary | ICD-10-CM | POA: Diagnosis not present

## 2018-09-26 DIAGNOSIS — K222 Esophageal obstruction: Secondary | ICD-10-CM | POA: Diagnosis not present

## 2018-09-26 DIAGNOSIS — Z881 Allergy status to other antibiotic agents status: Secondary | ICD-10-CM | POA: Diagnosis not present

## 2018-09-29 DIAGNOSIS — K750 Abscess of liver: Secondary | ICD-10-CM | POA: Diagnosis not present

## 2018-09-30 DIAGNOSIS — K750 Abscess of liver: Secondary | ICD-10-CM | POA: Diagnosis not present

## 2018-09-30 DIAGNOSIS — K831 Obstruction of bile duct: Secondary | ICD-10-CM | POA: Diagnosis not present

## 2018-10-07 DIAGNOSIS — K750 Abscess of liver: Secondary | ICD-10-CM | POA: Diagnosis not present

## 2018-10-07 DIAGNOSIS — K831 Obstruction of bile duct: Secondary | ICD-10-CM | POA: Diagnosis not present

## 2018-10-14 DIAGNOSIS — K831 Obstruction of bile duct: Secondary | ICD-10-CM | POA: Diagnosis not present

## 2018-10-14 DIAGNOSIS — K750 Abscess of liver: Secondary | ICD-10-CM | POA: Diagnosis not present

## 2018-10-21 DIAGNOSIS — K750 Abscess of liver: Secondary | ICD-10-CM | POA: Diagnosis not present

## 2018-10-21 DIAGNOSIS — K831 Obstruction of bile duct: Secondary | ICD-10-CM | POA: Diagnosis not present

## 2018-12-19 IMAGING — MR MR ABDOMEN WO/W CM
10 of 19 series · 20 of 48 positions shown · IV contrast (multihance)
Comparison: 01/23/2017 CT abdomen/ pelvis.

CLINICAL DATA: 46-year-old female inpatient admitted with
epigastric abdominal pain, nausea and fever with right liver dome
mass on CT. Remote cholecystectomy, reportedly complicated by bile
duct injury.

EXAM:
MRI ABDOMEN WITHOUT AND WITH CONTRAST
TECHNIQUE: Multiplanar multisequence MR imaging of the abdomen was performed
both before and after the administration of intravenous contrast.
CONTRAST:  20 cc MultiHance IV.

[Series 3: DWI b500 · axial · 6.0mm · 1.56mm/px · z∈[-91,+206]mm · 2 of 78 slices shown]
[im 1/78]
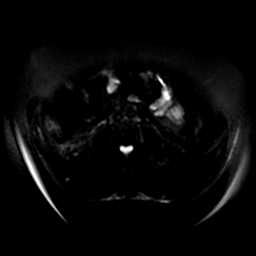
[im 78/78]
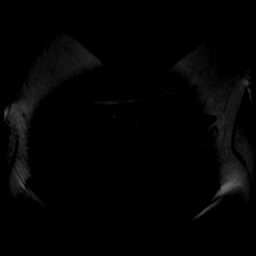

[Series 4: T2 fat-sat · axial · 5.0mm · 0.78mm/px · z∈[-92,+213]mm · 2 of 62 slices shown]
[im 1/62]
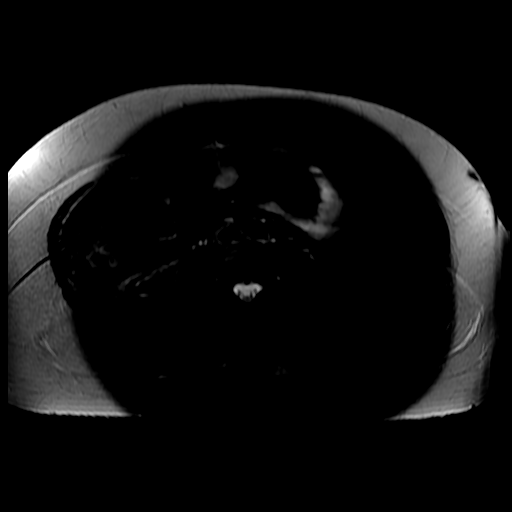
[im 62/62]
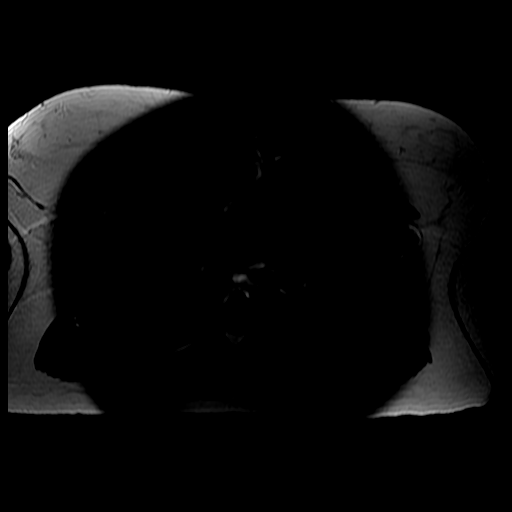

[Series 6: ax dualecho · axial · 5.0mm · 0.78mm/px · z∈[-95,+205]mm · 4 of 122 slices shown]
[im 1/122]
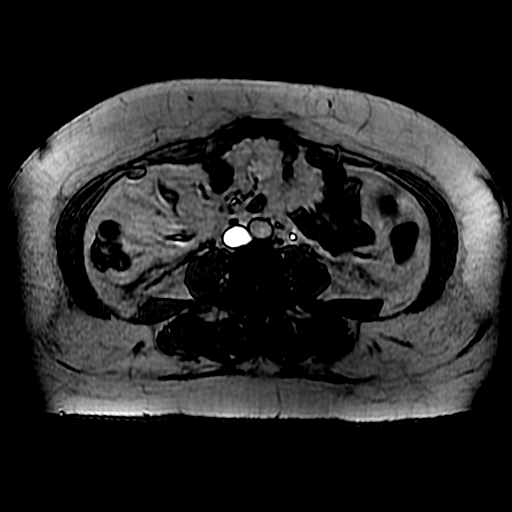
[im 41/122]
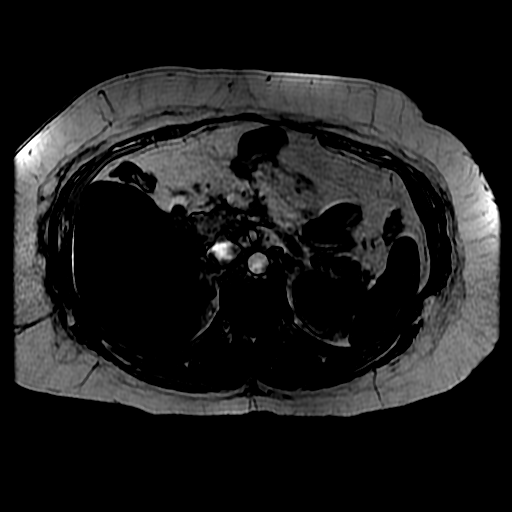
[im 81/122]
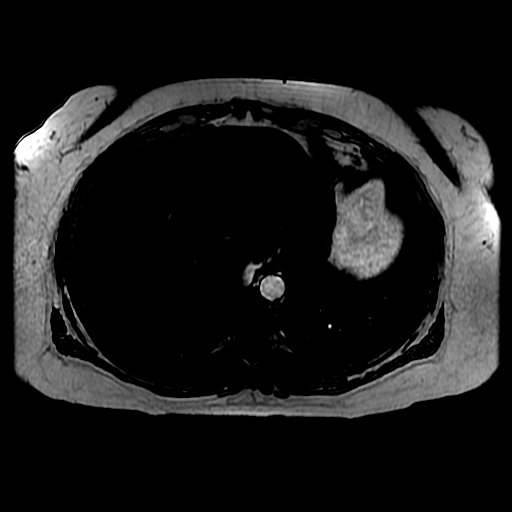
[im 122/122]
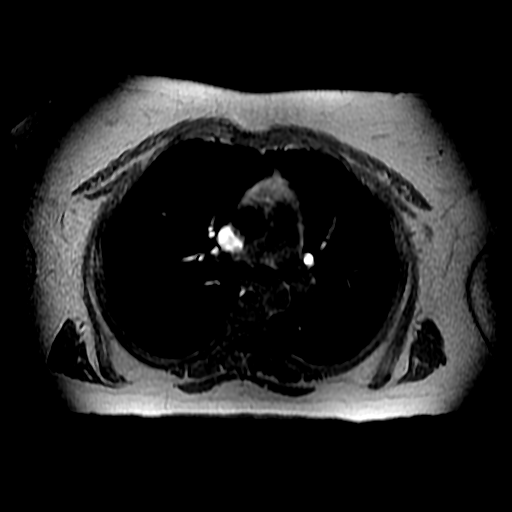

[Series 7: T2 · axial · 5.0mm · 0.78mm/px · z∈[-95,+205]mm · 2 of 61 slices shown (1 of 2)]
[im 1/61]
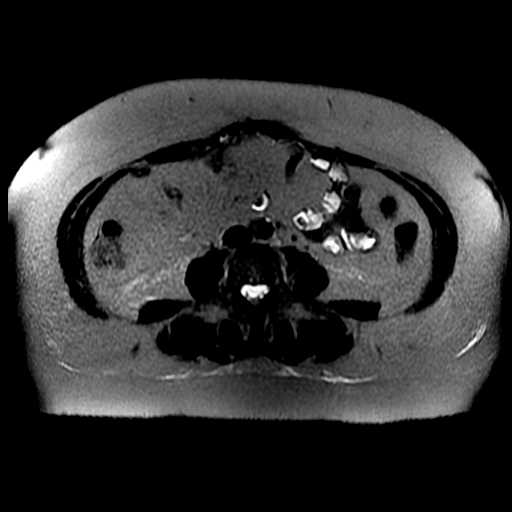
[im 61/61]
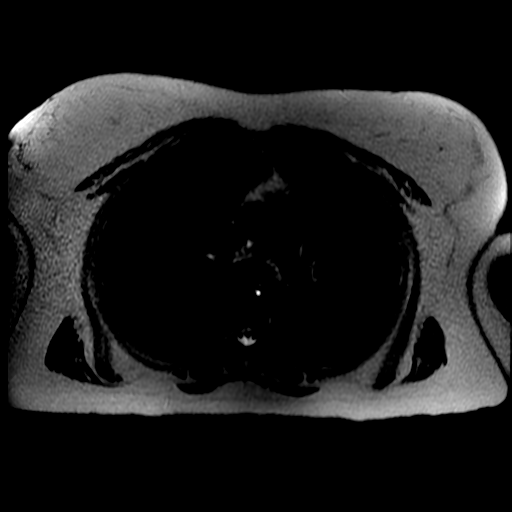

[Series 8: T2 · coronal · 5.0mm · 0.82mm/px · 1 of 46 slices shown (2 of 2)]
[im 1/46]
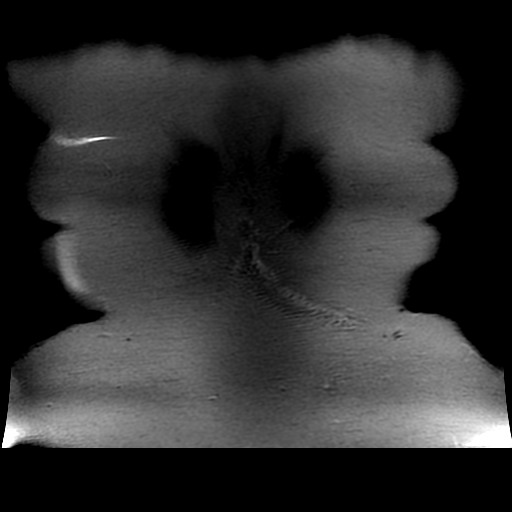

[Series 9: bSSFP · axial · 5.0mm · 0.78mm/px · z∈[-92,+208]mm · 2 of 61 slices shown]
[im 1/61]
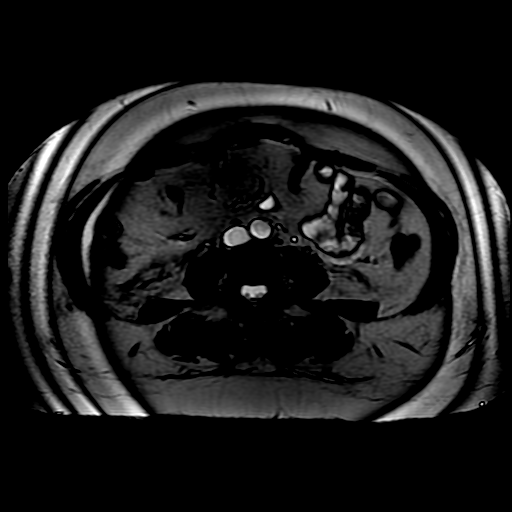
[im 61/61]
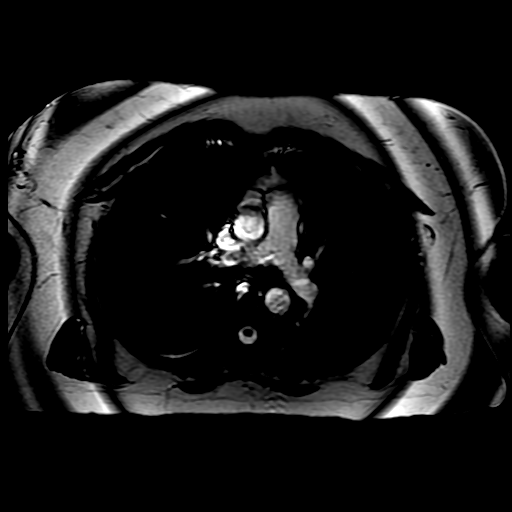

[Series 300: DWI · axial · 6.0mm · 1.56mm/px · 1 of 39 slices shown (1 of 2)]
[im 1/39]
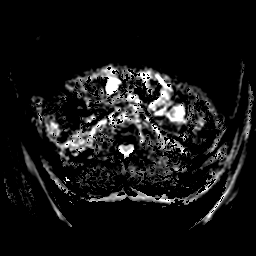

[Series 301: DWI · axial · 6.0mm · 1.56mm/px · 1 of 39 slices shown (2 of 2)]
[im 1/39]
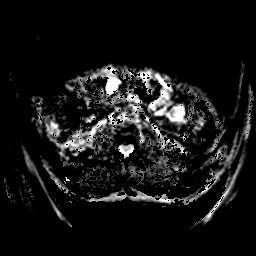

[Series 1000: T1 dynamic · axial · 5.8mm · 0.78mm/px · z∈[-97,+202]mm · 3 of 104 slices shown (1 of 2)]
[im 1/104]
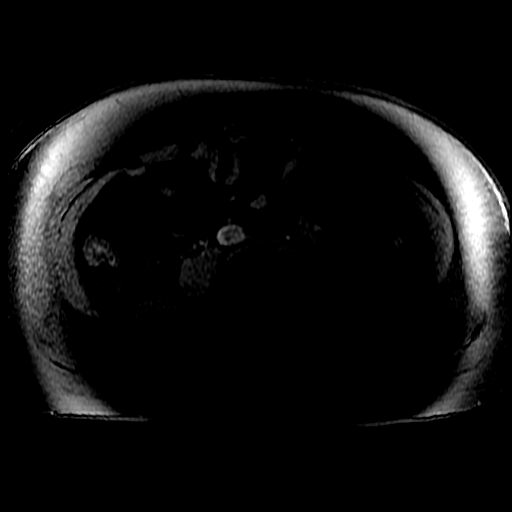
[im 52/104]
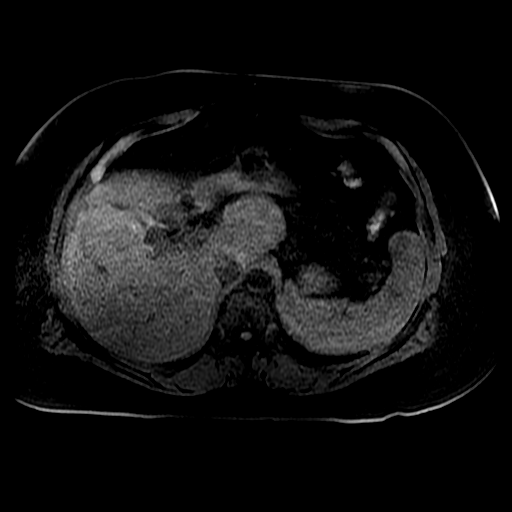
[im 104/104]
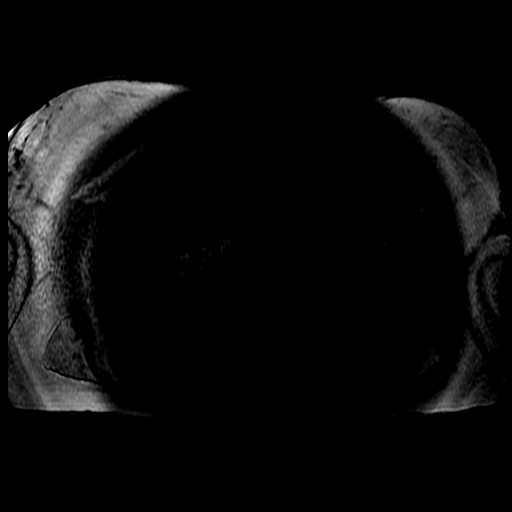

[Series 1001: T1 dynamic · axial · 5.8mm · 0.78mm/px · z∈[-97,+51]mm · 2 of 104 slices shown (2 of 2)]
[im 1/104]
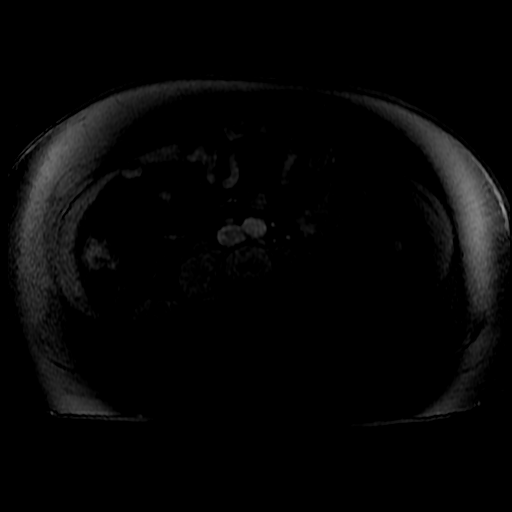
[im 52/104]
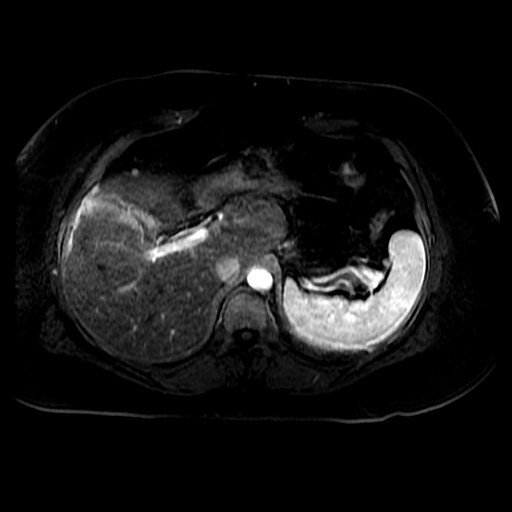

[20 of 48 positions shown; findings below may reference images not displayed]

FINDINGS: Lower chest: Elevation of the right hemidiaphragm. Mild-to-moderate
right basilar atelectasis. A few mildly enlarged right
pericardiophrenic nodes measuring up to 1.0 cm (series 3114/ image
28).

Hepatobiliary: Mild hepatomegaly. Moderate to severe diffuse hepatic
steatosis. There is a 12.3 x 8.9 x 10.6 cm right superior liver mass
with lobular contour centered within segment 8 of the right liver
lobe, with extension into liver segments 4A and 7, which
demonstrates thick enhancing wall, numerous thick enhancing internal
septations and prominent restricted diffusion, compatible with an
hepatic abscess. A few additional scattered subcentimeter foci of T2
hyperintensity are noted in the anterior right liver lobe, which
demonstrate no appreciable enhancement. Status post cholecystectomy.
There is prominent intrahepatic biliary ductal dilatation isolated
to segments 5, 7 and 8 in the right liver lobe, with sharp tapering
of the dilated intrahepatic right liver lobe bile ducts centrally
near the porta hepatis, without an appreciable obstructing mass or
stone in this location. There is evidence of a biliary enteric
anastomosis associated with the biliary stricture site. Scattered
low signal intensity in the nondependent portion of the dilated
intrahepatic right liver lobe bile ducts is compatible with
pneumobilia as seen on the CT study. The left liver lobe and
inferior right liver lobe intrahepatic bile ducts are normal
caliber. Common bile duct diameter 8 mm. No evidence of
choledocholithiasis.

Pancreas: No pancreatic mass or duct dilation.  No pancreas divisum.

Spleen: Normal size. No mass.

Adrenals/Urinary Tract: Normal adrenals. Normal size kidneys. No
hydronephrosis. There is a T2 hypointense 0.9 cm renal cortical
focus in the inferior right kidney (series 7/ image 56) without
enhancement on the subtraction sequences, correlating with a focus
of calcification on the recent CT study, most compatible with
scarring or a calcified hemorrhagic cyst. Subcentimeter simple lower
left renal cyst. No suspicious renal masses.

Stomach/Bowel: Grossly normal stomach. Visualized small and large
bowel is normal caliber, with no bowel wall thickening.

Vascular/Lymphatic: Normal caliber abdominal aorta. Patent portal,
splenic, hepatic and renal veins. Mild porta hepatis adenopathy
measuring up to the 1.6 cm (series 0559/ image 61).

Other: No ascites.  No additional fluid collections.

Musculoskeletal: No aggressive appearing focal osseous lesions.
IMPRESSION: 1. Large 12.3 cm right superior hepatic abscess, multilocular.
Additional subcentimeter scattered T2 hyperintense nonenhancing
right liver foci, probably additional tiny satellite abscesses or
cysts.
2. Evidence of a high-grade stricture at the site of a
biliary-enteric anastomosis draining segments 5, 7 and 8 of the
right liver lobe. No evidence of obstructing mass or stone.
3. Normal caliber CBD (8 mm diameter) status post cholecystectomy.
Left liver lobe and inferior right liver lobe bile ducts are normal
caliber.
4. Nonspecific pericardiophrenic and porta hepatis adenopathy,
probably reactive. Recommend post treatment MRI abdomen without and
with IV contrast in 3 months .
5. Elevation of the right hemidiaphragm with right basilar
atelectasis .
6. Moderate to severe diffuse hepatic steatosis.

These results were called by telephone at the time of interpretation
on 01/25/2017 at [DATE] to Dr. PACZEK CALKA, who verbally
acknowledged these results.

## 2018-12-19 IMAGING — CT CT CORE BIOPSY RENAL
3 of 6 series · 12 of 32 positions shown, 17 images · non-contrast
Comparison: CT abdomen pelvis - 01/23/2017;

INDICATION: Remote history of cholecystectomy complicated by a biliary injury
requiring creation of a hepaticojejunostomy complicated by a
recurrent biliary stricture requiring prolonged biliary catheter
placement with multiple subsequent biliary angioplasties (per pt
report), all of which was performed at outside institutions.

[Series 2: i-spiral 5.0 b31f · axial · 0.85mm/px · z∈[-454,-255]mm · 6 of 81 slices shown, 11 images (1 of 3)]
[im 12/81  soft-tissue]
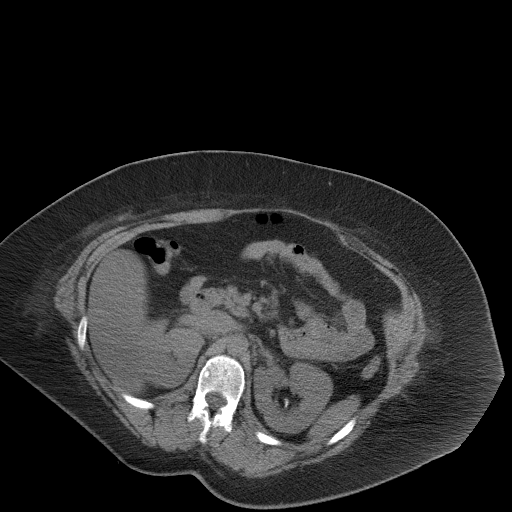
[im 12/81  bone]
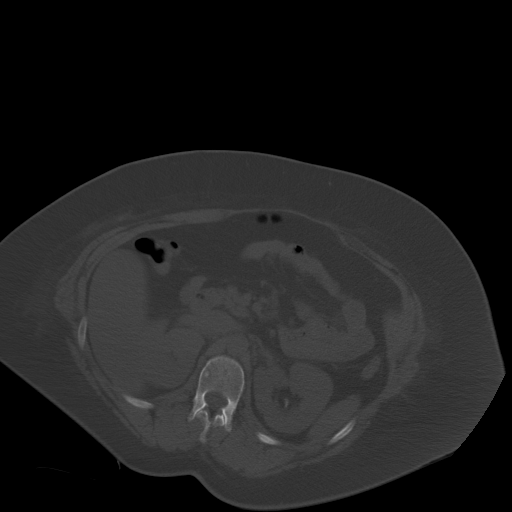
[im 23/81  soft-tissue]
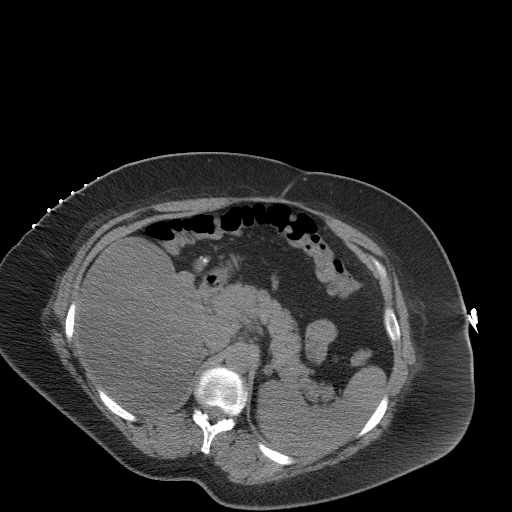
[im 35/81  soft-tissue]
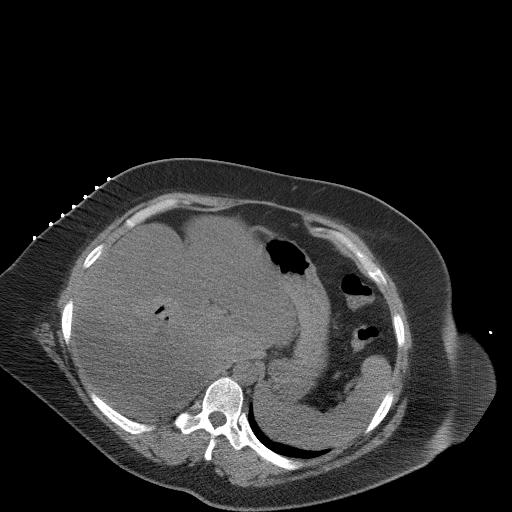
[im 35/81  lung]
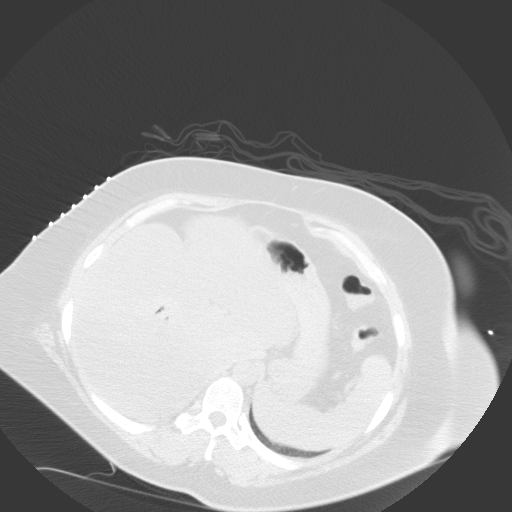
[im 46/81  soft-tissue]
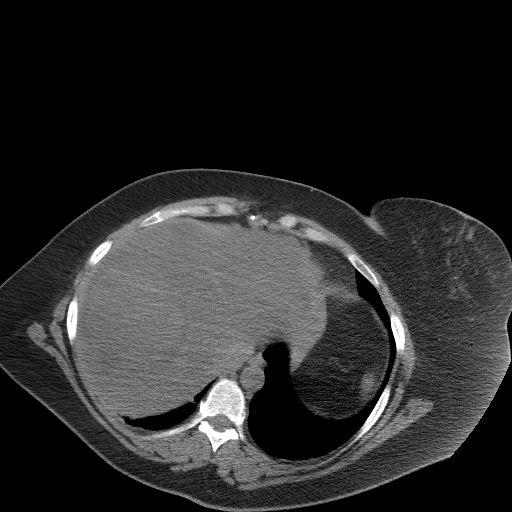
[im 46/81  lung]
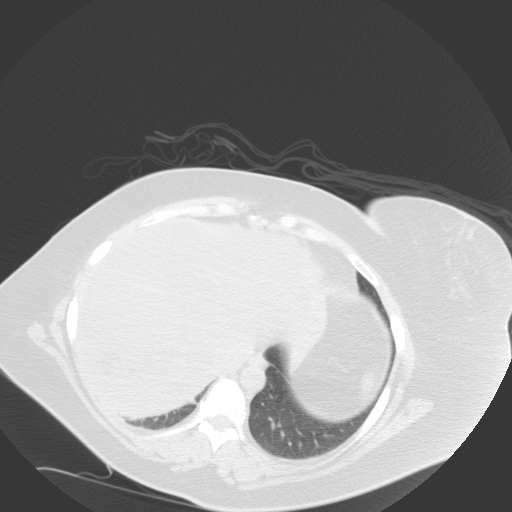
[im 58/81  soft-tissue]
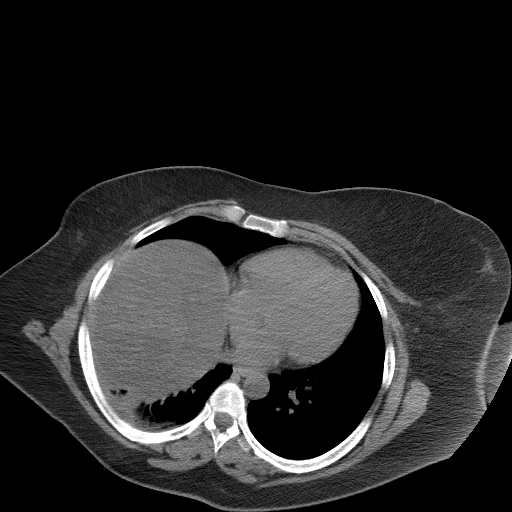
[im 58/81  lung]
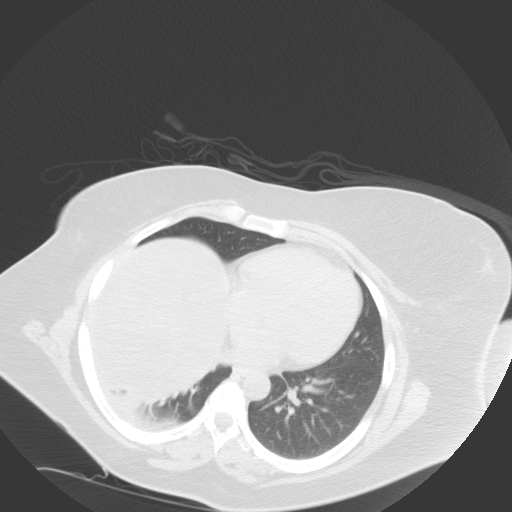
[im 69/81  soft-tissue]
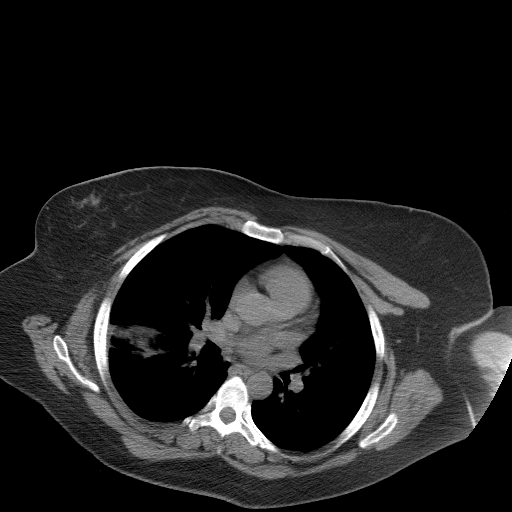
[im 69/81  lung]
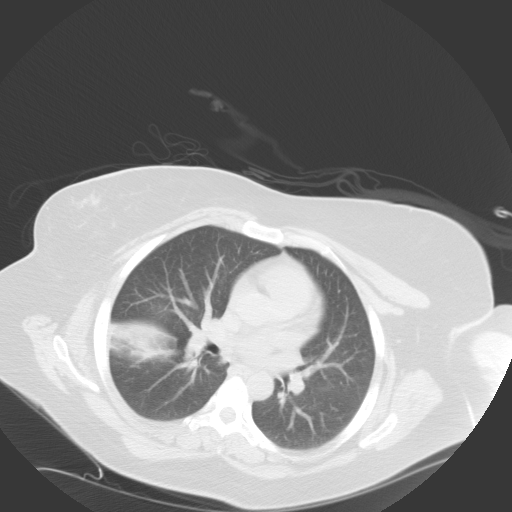

[Series 4: i-spiral 5.0 b31f · axial · 0.85mm/px · z∈[-406,-329]mm · 3 of 44 slices shown (2 of 3)]
[im 11/44  soft-tissue]
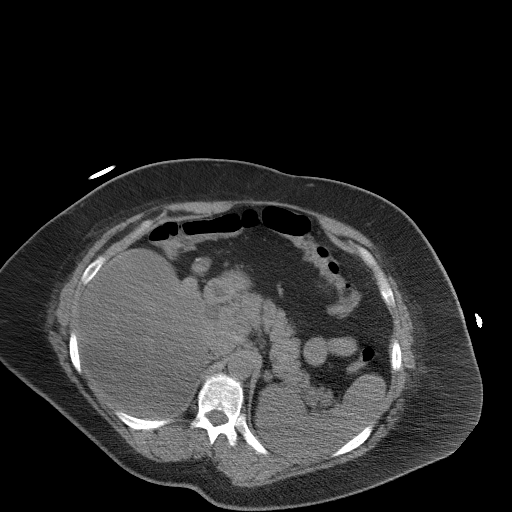
[im 22/44  soft-tissue]
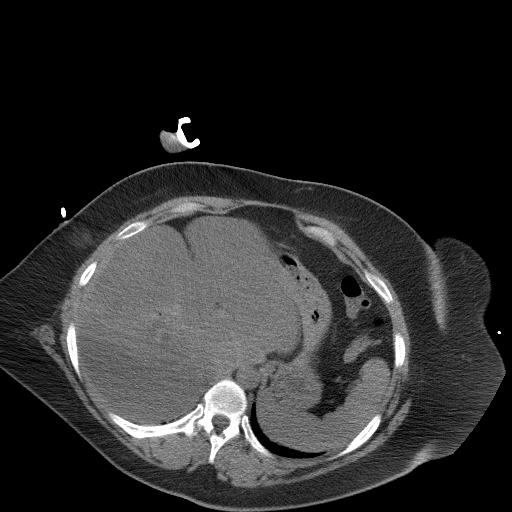
[im 33/44  soft-tissue]
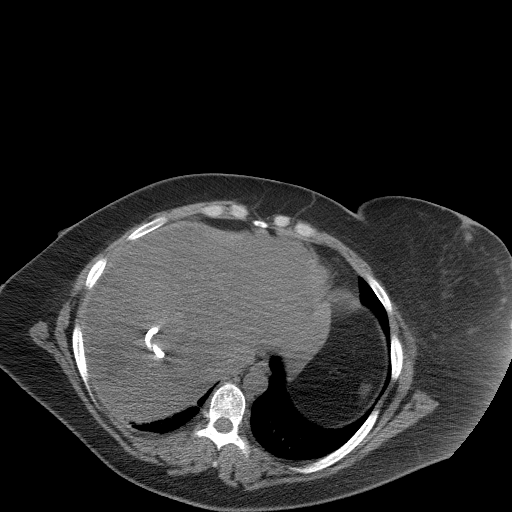

[Series 5: i-spiral 5.0 b31f · axial · 0.85mm/px · z∈[-406,-329]mm · 3 of 44 slices shown (3 of 3)]
[im 11/44  soft-tissue]
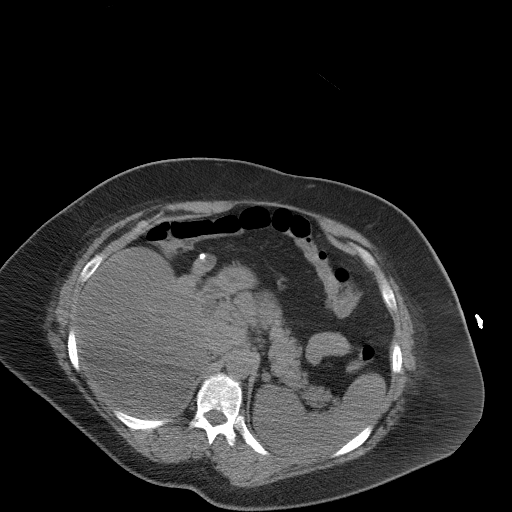
[im 22/44  soft-tissue]
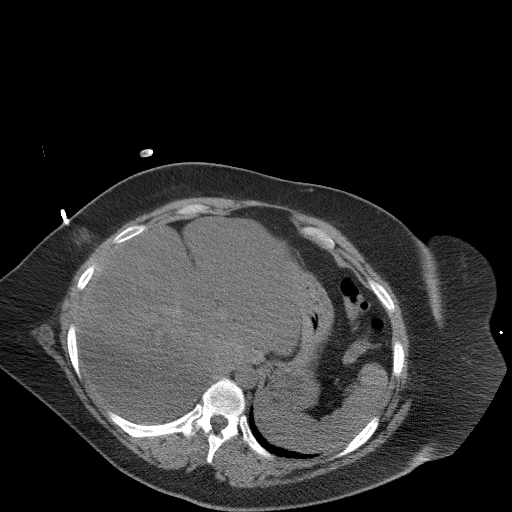
[im 33/44  soft-tissue]
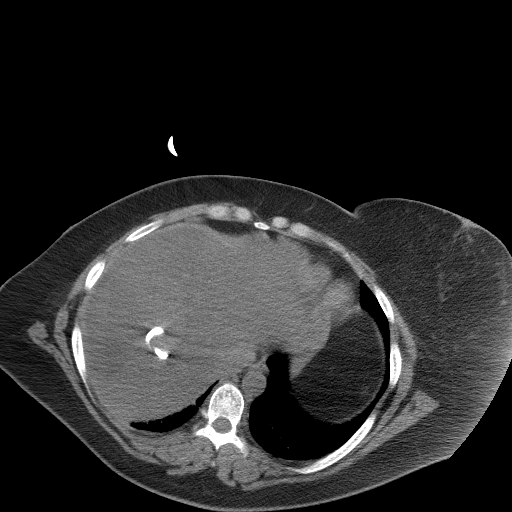

[12 of 32 positions shown; findings below may reference images not displayed]

Patient now presents with right upper quadrant abdominal pain, fever
and chills with cross-sectional imaging concerning for development
of a large multi-locular abscess within the right lobe of the liver.

Request made for percutaneous drainage catheter placement for
infection source control purposes.

EXAM:
ULTRASOUND AND CT-GUIDED HEPATIC ABSCESS DRAINAGE CATHETER PLACEMENT
X2
abdominal MRI -
01/25/2017

MEDICATIONS:
The patient is currently admitted to the hospital and receiving
intravenous antibiotics. The antibiotics were administered within an
appropriate time frame prior to the initiation of the procedure.

ANESTHESIA/SEDATION:
Moderate (conscious) sedation was employed during this procedure. A
total of Versed 4 mg and Fentanyl 250 mcg was administered
intravenously.

Moderate Sedation Time: 36 minutes. The patient's level of
consciousness and vital signs were monitored continuously by
radiology nursing throughout the procedure under my direct
supervision.

CONTRAST:  None

COMPLICATIONS:
None immediate.

PROCEDURE:
Informed written consent was obtained from the patient after a
discussion of the risks, benefits and alternatives to treatment. The
patient was placed supine on the CT gantry and a pre procedural CT
was performed re-demonstrating the known abscess/fluid collection
within the right lobe of the liver with dominant ill-defined
component measuring at least 10.0 x 8.6 cm (image 33, series 2). The
procedure was planned. A timeout was performed prior to the
initiation of the procedure.

The skin overlying the right upper abdominal quadrant was prepped
and draped in the usual sterile fashion. The overlying soft tissues
were anesthetized with 1% lidocaine with epinephrine. Under direct
ultrasound guidance, the dominant hypoechoic component within the
cranial medial aspect of the multiloculated abscess was cannulated
with an 18 gauge trocar needle. An Amplatz wire was coiled within
the dominant component of the abscess. Appropriate position was
confirmed with CT imaging and the track was serially dilated
allowing placement of a 12 French percutaneous drainage catheter.
Appropriate position was confirmed with a limited CT scan.

Next, the dominant hypoechoic component within in the inferolateral
aspect of the multiloculated abscess was cannulated with an 18 gauge
trocar needle. An Amplatz wire was coiled within this component of
the abscess. Appropriate position was confirmed with CT imaging and
the track was serially dilated allowing placement of a 12 French
percutaneous drainage catheter. Appropriate positioning was
confirmed with a limited CT scan.

Ultimately, approximately 200 cc of purulent, slightly blood tinged
fluid was aspirated from the abscess.

Both hepatic abscess drainage catheters were connected to JP bulb is
and sutured in place. Dressings were placed. The patient tolerated
the above procedures well without immediate postprocedural
complication.
IMPRESSION: Successful ultrasound and CT-guided placement of two 12 French
percutaneous hepatic abscess drainage catheters.

One of the hepatic abscess drainage catheters is coiled within the
dominant component within the cranial-medial aspect of the
multiloculated hepatic abscess while the additional drainage
catheter is coiled within the dominant component of the
inferolateral aspect of the hepatic abscess.

Samples were sent to the laboratory as requested by the ordering
clinical team.

## 2018-12-25 IMAGING — CR DG CHEST 2V
2 series · 2 of 2 positions shown · non-contrast
Comparison: None.

CLINICAL DATA: Chest pain

EXAM:
CHEST  2 VIEW

[w chest pa]
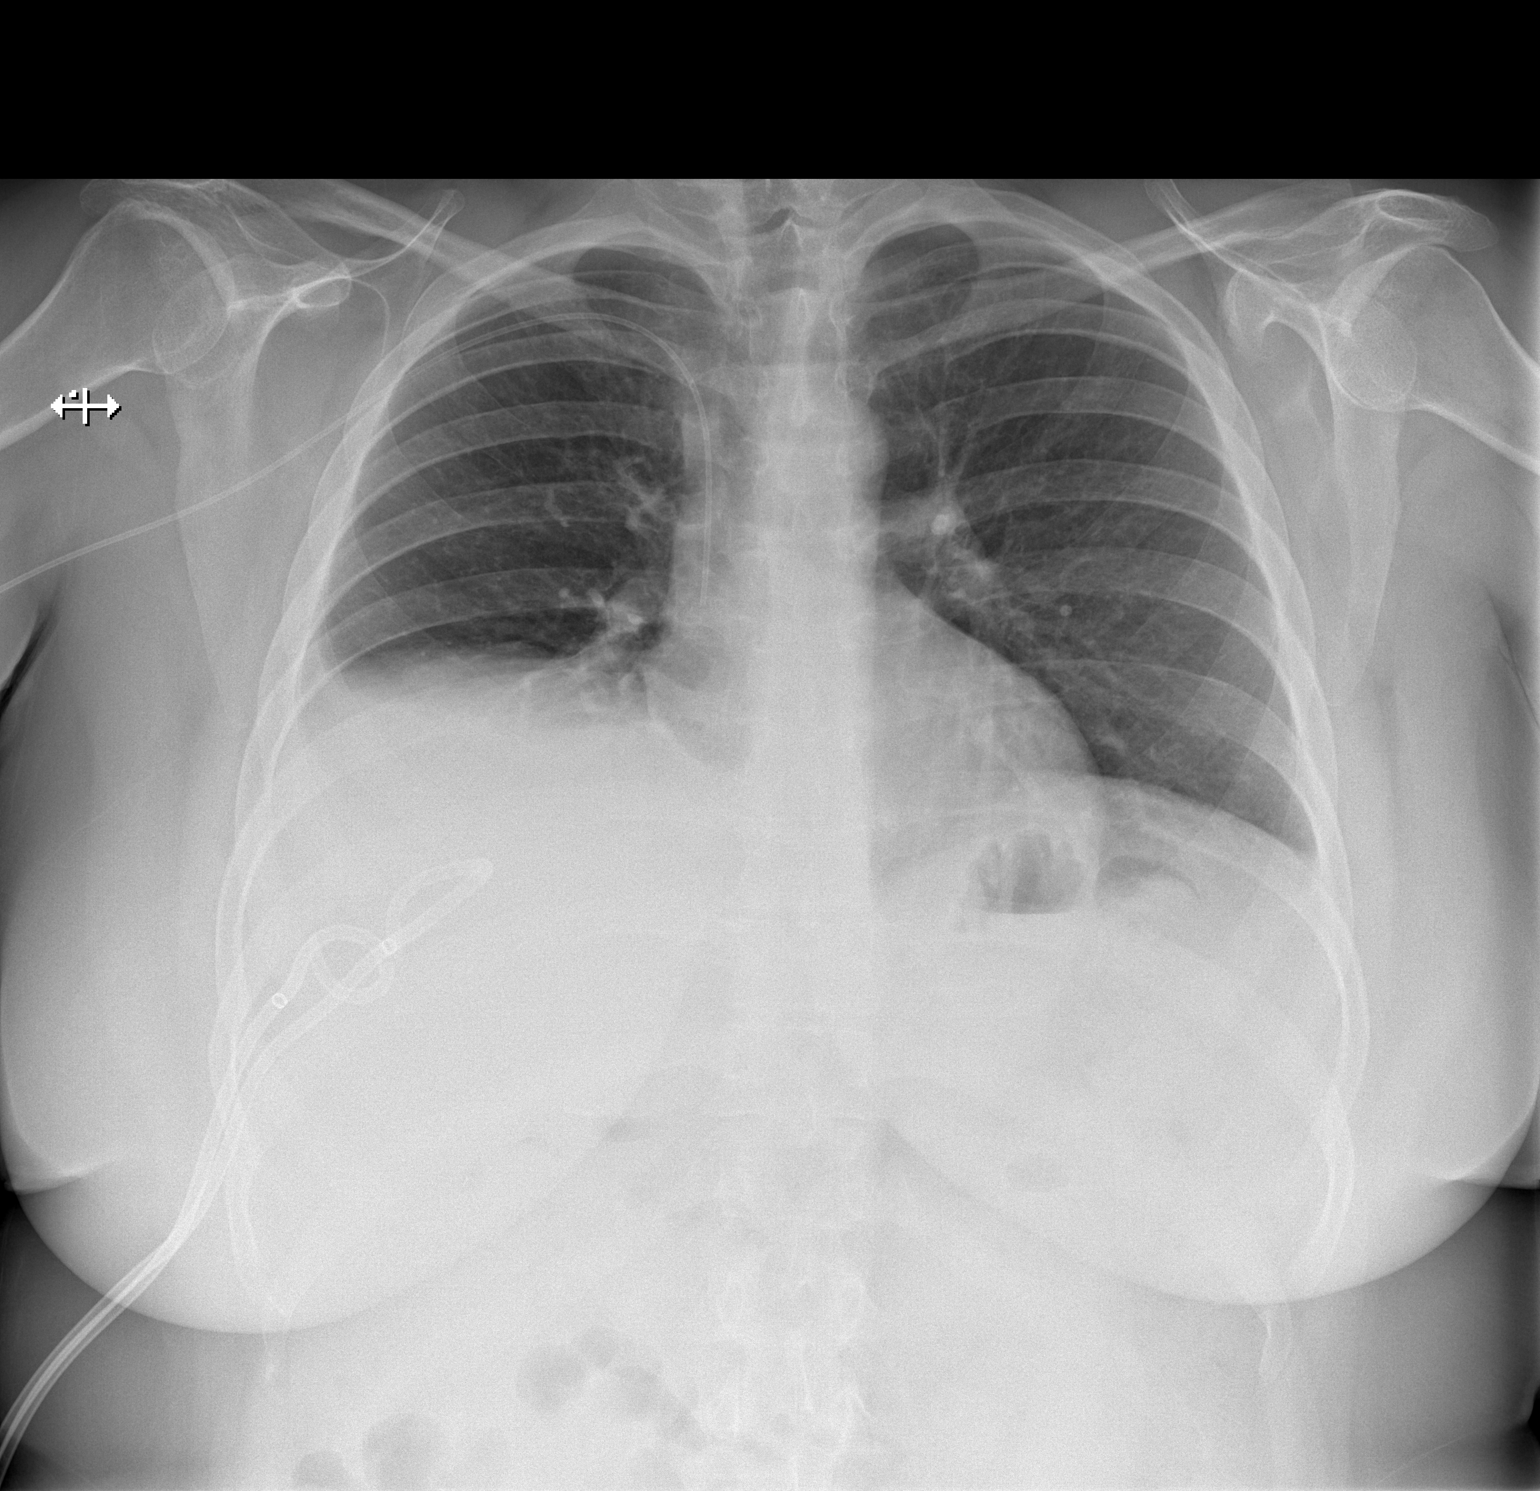

[w chest lat]
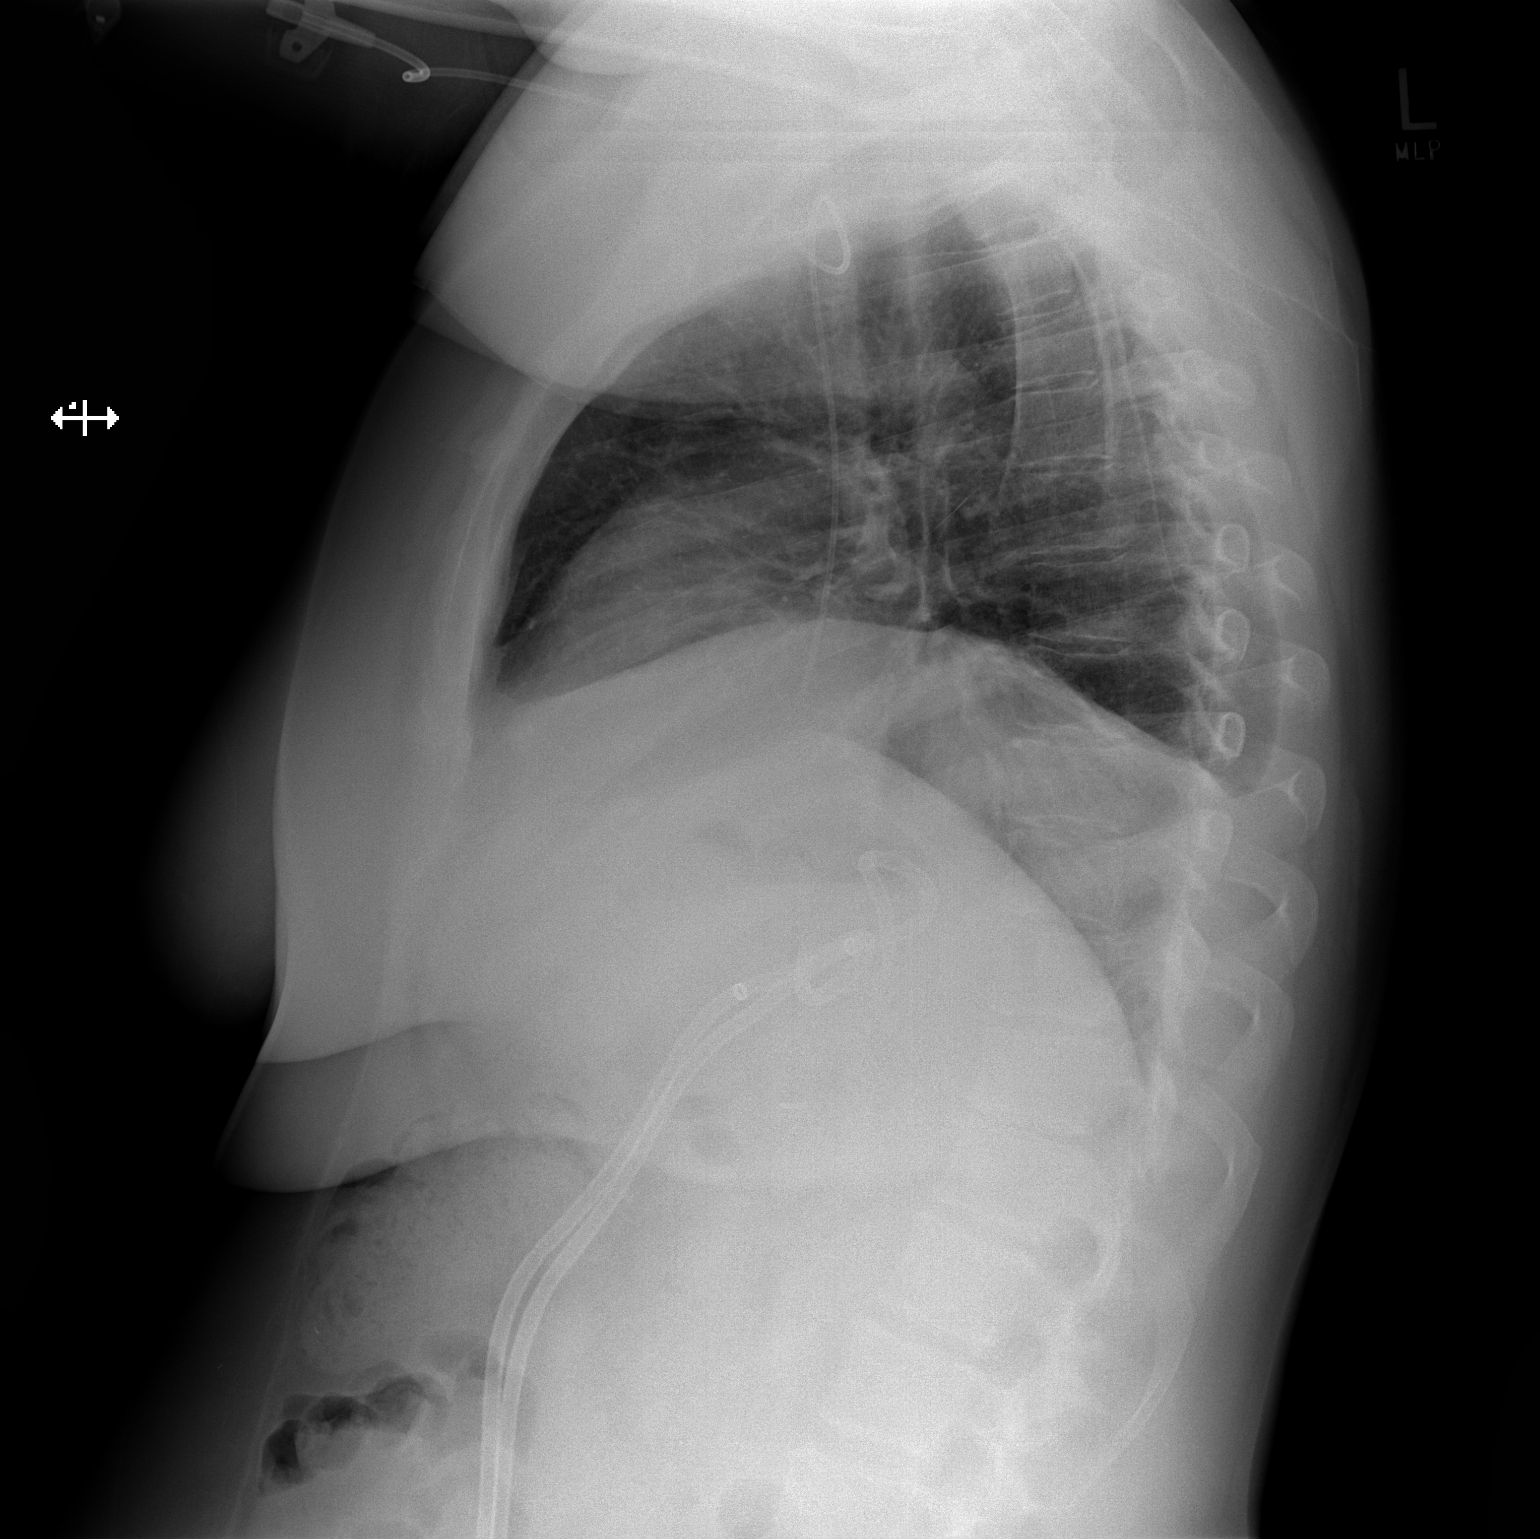

[2 of 2 positions shown; findings below may reference images not displayed]

FINDINGS: Two views study shows low volumes with mild asymmetric elevation of
the right hemidiaphragm. There is a small right pleural effusion
with right base atelectasis. Left lung clear. Heart size normal.
Right PICC line tip overlies the distal SVC. Pigtail drainage
catheters overly the right upper quadrant in this patient with known
hepatic abscess.
IMPRESSION: Low volume film with right base atelectasis and small right pleural
effusion.

## 2018-12-30 DIAGNOSIS — K750 Abscess of liver: Secondary | ICD-10-CM | POA: Diagnosis not present

## 2018-12-30 DIAGNOSIS — K831 Obstruction of bile duct: Secondary | ICD-10-CM | POA: Diagnosis not present

## 2018-12-30 DIAGNOSIS — E785 Hyperlipidemia, unspecified: Secondary | ICD-10-CM | POA: Diagnosis not present

## 2019-01-05 DIAGNOSIS — Z1231 Encounter for screening mammogram for malignant neoplasm of breast: Secondary | ICD-10-CM | POA: Diagnosis not present

## 2019-01-17 DIAGNOSIS — M2011 Hallux valgus (acquired), right foot: Secondary | ICD-10-CM | POA: Diagnosis not present

## 2019-01-17 DIAGNOSIS — S9031XA Contusion of right foot, initial encounter: Secondary | ICD-10-CM | POA: Diagnosis not present

## 2019-01-17 DIAGNOSIS — M2012 Hallux valgus (acquired), left foot: Secondary | ICD-10-CM | POA: Diagnosis not present

## 2019-01-31 DIAGNOSIS — R109 Unspecified abdominal pain: Secondary | ICD-10-CM | POA: Diagnosis not present

## 2019-02-01 DIAGNOSIS — K760 Fatty (change of) liver, not elsewhere classified: Secondary | ICD-10-CM | POA: Diagnosis not present

## 2019-02-20 DIAGNOSIS — D691 Qualitative platelet defects: Secondary | ICD-10-CM | POA: Diagnosis not present

## 2019-03-15 DIAGNOSIS — R509 Fever, unspecified: Secondary | ICD-10-CM | POA: Diagnosis not present

## 2019-03-15 DIAGNOSIS — R1011 Right upper quadrant pain: Secondary | ICD-10-CM | POA: Diagnosis not present

## 2019-03-15 DIAGNOSIS — Z20822 Contact with and (suspected) exposure to covid-19: Secondary | ICD-10-CM | POA: Diagnosis not present

## 2019-03-21 DIAGNOSIS — K750 Abscess of liver: Secondary | ICD-10-CM | POA: Diagnosis not present

## 2019-03-23 DIAGNOSIS — R509 Fever, unspecified: Secondary | ICD-10-CM | POA: Diagnosis not present

## 2019-03-23 DIAGNOSIS — R1031 Right lower quadrant pain: Secondary | ICD-10-CM | POA: Diagnosis not present

## 2019-03-23 DIAGNOSIS — K750 Abscess of liver: Secondary | ICD-10-CM | POA: Diagnosis not present

## 2019-03-28 DIAGNOSIS — Z01818 Encounter for other preprocedural examination: Secondary | ICD-10-CM | POA: Diagnosis not present

## 2019-03-28 DIAGNOSIS — Z20822 Contact with and (suspected) exposure to covid-19: Secondary | ICD-10-CM | POA: Diagnosis not present

## 2019-03-30 DIAGNOSIS — Z91041 Radiographic dye allergy status: Secondary | ICD-10-CM | POA: Diagnosis not present

## 2019-03-30 DIAGNOSIS — K651 Peritoneal abscess: Secondary | ICD-10-CM | POA: Diagnosis not present

## 2019-03-30 DIAGNOSIS — Z886 Allergy status to analgesic agent status: Secondary | ICD-10-CM | POA: Diagnosis not present

## 2019-03-30 DIAGNOSIS — Z20822 Contact with and (suspected) exposure to covid-19: Secondary | ICD-10-CM | POA: Diagnosis not present

## 2019-03-30 DIAGNOSIS — Z888 Allergy status to other drugs, medicaments and biological substances status: Secondary | ICD-10-CM | POA: Diagnosis not present

## 2019-03-30 DIAGNOSIS — Z880 Allergy status to penicillin: Secondary | ICD-10-CM | POA: Diagnosis not present

## 2019-03-30 DIAGNOSIS — Z885 Allergy status to narcotic agent status: Secondary | ICD-10-CM | POA: Diagnosis not present

## 2019-03-30 DIAGNOSIS — Z881 Allergy status to other antibiotic agents status: Secondary | ICD-10-CM | POA: Diagnosis not present

## 2019-03-30 DIAGNOSIS — K750 Abscess of liver: Secondary | ICD-10-CM | POA: Diagnosis not present

## 2019-03-30 DIAGNOSIS — Z9049 Acquired absence of other specified parts of digestive tract: Secondary | ICD-10-CM | POA: Diagnosis not present

## 2019-03-31 DIAGNOSIS — K651 Peritoneal abscess: Secondary | ICD-10-CM | POA: Diagnosis not present

## 2019-04-10 DIAGNOSIS — R509 Fever, unspecified: Secondary | ICD-10-CM | POA: Diagnosis not present

## 2019-04-10 DIAGNOSIS — Z91041 Radiographic dye allergy status: Secondary | ICD-10-CM | POA: Diagnosis not present

## 2019-04-10 DIAGNOSIS — Z20822 Contact with and (suspected) exposure to covid-19: Secondary | ICD-10-CM | POA: Diagnosis not present

## 2019-04-10 DIAGNOSIS — K651 Peritoneal abscess: Secondary | ICD-10-CM | POA: Diagnosis not present

## 2019-04-10 DIAGNOSIS — K750 Abscess of liver: Secondary | ICD-10-CM | POA: Diagnosis not present

## 2019-04-11 DIAGNOSIS — K750 Abscess of liver: Secondary | ICD-10-CM | POA: Diagnosis not present

## 2019-04-20 DIAGNOSIS — Z20822 Contact with and (suspected) exposure to covid-19: Secondary | ICD-10-CM | POA: Diagnosis not present

## 2019-04-20 DIAGNOSIS — K316 Fistula of stomach and duodenum: Secondary | ICD-10-CM | POA: Diagnosis not present

## 2019-04-20 DIAGNOSIS — Z791 Long term (current) use of non-steroidal anti-inflammatories (NSAID): Secondary | ICD-10-CM | POA: Diagnosis not present

## 2019-04-20 DIAGNOSIS — Z7951 Long term (current) use of inhaled steroids: Secondary | ICD-10-CM | POA: Diagnosis not present

## 2019-04-20 DIAGNOSIS — J45909 Unspecified asthma, uncomplicated: Secondary | ICD-10-CM | POA: Diagnosis not present

## 2019-04-20 DIAGNOSIS — Z9049 Acquired absence of other specified parts of digestive tract: Secondary | ICD-10-CM | POA: Diagnosis not present

## 2019-04-20 DIAGNOSIS — Z79899 Other long term (current) drug therapy: Secondary | ICD-10-CM | POA: Diagnosis not present

## 2019-04-20 DIAGNOSIS — Z7952 Long term (current) use of systemic steroids: Secondary | ICD-10-CM | POA: Diagnosis not present

## 2019-04-20 DIAGNOSIS — Z01812 Encounter for preprocedural laboratory examination: Secondary | ICD-10-CM | POA: Diagnosis not present

## 2019-04-20 DIAGNOSIS — K632 Fistula of intestine: Secondary | ICD-10-CM | POA: Diagnosis not present

## 2019-04-21 DIAGNOSIS — Z79899 Other long term (current) drug therapy: Secondary | ICD-10-CM | POA: Diagnosis not present

## 2019-04-21 DIAGNOSIS — Z4682 Encounter for fitting and adjustment of non-vascular catheter: Secondary | ICD-10-CM | POA: Diagnosis not present

## 2019-04-21 DIAGNOSIS — K750 Abscess of liver: Secondary | ICD-10-CM | POA: Diagnosis not present

## 2019-04-21 DIAGNOSIS — K632 Fistula of intestine: Secondary | ICD-10-CM | POA: Diagnosis not present

## 2019-04-21 DIAGNOSIS — J9811 Atelectasis: Secondary | ICD-10-CM | POA: Diagnosis not present

## 2019-04-21 DIAGNOSIS — K651 Peritoneal abscess: Secondary | ICD-10-CM | POA: Diagnosis not present

## 2019-04-22 DIAGNOSIS — K651 Peritoneal abscess: Secondary | ICD-10-CM | POA: Diagnosis not present

## 2019-04-22 DIAGNOSIS — J9811 Atelectasis: Secondary | ICD-10-CM | POA: Diagnosis not present

## 2019-04-22 DIAGNOSIS — K750 Abscess of liver: Secondary | ICD-10-CM | POA: Diagnosis not present

## 2019-04-22 DIAGNOSIS — Z79899 Other long term (current) drug therapy: Secondary | ICD-10-CM | POA: Diagnosis not present

## 2019-04-27 DIAGNOSIS — K750 Abscess of liver: Secondary | ICD-10-CM | POA: Diagnosis not present

## 2019-04-28 DIAGNOSIS — R109 Unspecified abdominal pain: Secondary | ICD-10-CM | POA: Diagnosis not present

## 2019-04-28 DIAGNOSIS — K750 Abscess of liver: Secondary | ICD-10-CM | POA: Diagnosis not present

## 2019-05-12 DIAGNOSIS — Z885 Allergy status to narcotic agent status: Secondary | ICD-10-CM | POA: Diagnosis not present

## 2019-05-12 DIAGNOSIS — Y838 Other surgical procedures as the cause of abnormal reaction of the patient, or of later complication, without mention of misadventure at the time of the procedure: Secondary | ICD-10-CM | POA: Diagnosis not present

## 2019-05-12 DIAGNOSIS — Z20822 Contact with and (suspected) exposure to covid-19: Secondary | ICD-10-CM | POA: Diagnosis not present

## 2019-05-12 DIAGNOSIS — K831 Obstruction of bile duct: Secondary | ICD-10-CM | POA: Diagnosis not present

## 2019-05-12 DIAGNOSIS — Z9049 Acquired absence of other specified parts of digestive tract: Secondary | ICD-10-CM | POA: Diagnosis not present

## 2019-05-12 DIAGNOSIS — K750 Abscess of liver: Secondary | ICD-10-CM | POA: Diagnosis not present

## 2019-05-12 DIAGNOSIS — K632 Fistula of intestine: Secondary | ICD-10-CM | POA: Diagnosis not present

## 2019-05-12 DIAGNOSIS — Z91041 Radiographic dye allergy status: Secondary | ICD-10-CM | POA: Diagnosis not present

## 2019-05-12 DIAGNOSIS — Z881 Allergy status to other antibiotic agents status: Secondary | ICD-10-CM | POA: Diagnosis not present

## 2019-05-12 DIAGNOSIS — T8183XA Persistent postprocedural fistula, initial encounter: Secondary | ICD-10-CM | POA: Diagnosis not present

## 2019-05-12 DIAGNOSIS — Z79899 Other long term (current) drug therapy: Secondary | ICD-10-CM | POA: Diagnosis not present

## 2019-05-12 DIAGNOSIS — Z880 Allergy status to penicillin: Secondary | ICD-10-CM | POA: Diagnosis not present

## 2019-05-13 DIAGNOSIS — Z79899 Other long term (current) drug therapy: Secondary | ICD-10-CM | POA: Diagnosis not present

## 2019-05-13 DIAGNOSIS — Y838 Other surgical procedures as the cause of abnormal reaction of the patient, or of later complication, without mention of misadventure at the time of the procedure: Secondary | ICD-10-CM | POA: Diagnosis not present

## 2019-05-13 DIAGNOSIS — K750 Abscess of liver: Secondary | ICD-10-CM | POA: Diagnosis not present

## 2019-05-13 DIAGNOSIS — Z885 Allergy status to narcotic agent status: Secondary | ICD-10-CM | POA: Diagnosis not present

## 2019-05-13 DIAGNOSIS — Z881 Allergy status to other antibiotic agents status: Secondary | ICD-10-CM | POA: Diagnosis not present

## 2019-05-13 DIAGNOSIS — Z20822 Contact with and (suspected) exposure to covid-19: Secondary | ICD-10-CM | POA: Diagnosis not present

## 2019-05-13 DIAGNOSIS — K831 Obstruction of bile duct: Secondary | ICD-10-CM | POA: Diagnosis not present

## 2019-05-13 DIAGNOSIS — T8183XA Persistent postprocedural fistula, initial encounter: Secondary | ICD-10-CM | POA: Diagnosis not present

## 2019-05-13 DIAGNOSIS — Z9049 Acquired absence of other specified parts of digestive tract: Secondary | ICD-10-CM | POA: Diagnosis not present

## 2019-05-13 DIAGNOSIS — Z880 Allergy status to penicillin: Secondary | ICD-10-CM | POA: Diagnosis not present

## 2019-05-13 DIAGNOSIS — Z91041 Radiographic dye allergy status: Secondary | ICD-10-CM | POA: Diagnosis not present

## 2019-05-13 DIAGNOSIS — K632 Fistula of intestine: Secondary | ICD-10-CM | POA: Diagnosis not present

## 2019-05-23 DIAGNOSIS — K750 Abscess of liver: Secondary | ICD-10-CM | POA: Diagnosis not present

## 2019-05-25 DIAGNOSIS — K750 Abscess of liver: Secondary | ICD-10-CM | POA: Diagnosis not present

## 2019-06-15 DIAGNOSIS — Z4803 Encounter for change or removal of drains: Secondary | ICD-10-CM | POA: Diagnosis not present

## 2019-06-15 DIAGNOSIS — Z79899 Other long term (current) drug therapy: Secondary | ICD-10-CM | POA: Diagnosis not present

## 2019-06-15 DIAGNOSIS — Z4682 Encounter for fitting and adjustment of non-vascular catheter: Secondary | ICD-10-CM | POA: Diagnosis not present

## 2019-06-15 DIAGNOSIS — K831 Obstruction of bile duct: Secondary | ICD-10-CM | POA: Diagnosis not present

## 2019-06-15 DIAGNOSIS — K632 Fistula of intestine: Secondary | ICD-10-CM | POA: Diagnosis not present

## 2019-06-15 DIAGNOSIS — K750 Abscess of liver: Secondary | ICD-10-CM | POA: Diagnosis not present

## 2019-06-15 DIAGNOSIS — K833 Fistula of bile duct: Secondary | ICD-10-CM | POA: Diagnosis not present

## 2019-06-29 DIAGNOSIS — R509 Fever, unspecified: Secondary | ICD-10-CM | POA: Diagnosis not present

## 2019-06-29 DIAGNOSIS — K750 Abscess of liver: Secondary | ICD-10-CM | POA: Diagnosis not present

## 2019-06-29 DIAGNOSIS — T85848A Pain due to other internal prosthetic devices, implants and grafts, initial encounter: Secondary | ICD-10-CM | POA: Diagnosis not present

## 2019-06-29 DIAGNOSIS — Z9689 Presence of other specified functional implants: Secondary | ICD-10-CM | POA: Diagnosis not present

## 2019-06-29 DIAGNOSIS — Z20822 Contact with and (suspected) exposure to covid-19: Secondary | ICD-10-CM | POA: Diagnosis not present

## 2019-06-29 DIAGNOSIS — Z91041 Radiographic dye allergy status: Secondary | ICD-10-CM | POA: Diagnosis not present

## 2019-06-29 DIAGNOSIS — Z9889 Other specified postprocedural states: Secondary | ICD-10-CM | POA: Diagnosis not present

## 2019-06-29 DIAGNOSIS — R109 Unspecified abdominal pain: Secondary | ICD-10-CM | POA: Diagnosis not present

## 2019-06-29 DIAGNOSIS — K7689 Other specified diseases of liver: Secondary | ICD-10-CM | POA: Diagnosis not present

## 2019-06-30 DIAGNOSIS — Z888 Allergy status to other drugs, medicaments and biological substances status: Secondary | ICD-10-CM | POA: Diagnosis not present

## 2019-06-30 DIAGNOSIS — Z9689 Presence of other specified functional implants: Secondary | ICD-10-CM | POA: Diagnosis not present

## 2019-06-30 DIAGNOSIS — Z886 Allergy status to analgesic agent status: Secondary | ICD-10-CM | POA: Diagnosis not present

## 2019-06-30 DIAGNOSIS — Z883 Allergy status to other anti-infective agents status: Secondary | ICD-10-CM | POA: Diagnosis not present

## 2019-06-30 DIAGNOSIS — Z87892 Personal history of anaphylaxis: Secondary | ICD-10-CM | POA: Diagnosis not present

## 2019-06-30 DIAGNOSIS — Z79899 Other long term (current) drug therapy: Secondary | ICD-10-CM | POA: Diagnosis not present

## 2019-06-30 DIAGNOSIS — Z881 Allergy status to other antibiotic agents status: Secondary | ICD-10-CM | POA: Diagnosis not present

## 2019-06-30 DIAGNOSIS — Z91041 Radiographic dye allergy status: Secondary | ICD-10-CM | POA: Diagnosis not present

## 2019-06-30 DIAGNOSIS — K750 Abscess of liver: Secondary | ICD-10-CM | POA: Diagnosis not present

## 2019-06-30 DIAGNOSIS — K651 Peritoneal abscess: Secondary | ICD-10-CM | POA: Diagnosis not present

## 2019-06-30 DIAGNOSIS — Z7952 Long term (current) use of systemic steroids: Secondary | ICD-10-CM | POA: Diagnosis not present

## 2019-06-30 DIAGNOSIS — Z880 Allergy status to penicillin: Secondary | ICD-10-CM | POA: Diagnosis not present

## 2019-06-30 DIAGNOSIS — Z885 Allergy status to narcotic agent status: Secondary | ICD-10-CM | POA: Diagnosis not present

## 2019-07-07 DIAGNOSIS — K750 Abscess of liver: Secondary | ICD-10-CM | POA: Diagnosis not present

## 2019-08-04 DIAGNOSIS — Z9011 Acquired absence of right breast and nipple: Secondary | ICD-10-CM | POA: Diagnosis not present

## 2019-08-04 DIAGNOSIS — K750 Abscess of liver: Secondary | ICD-10-CM | POA: Diagnosis not present

## 2019-08-04 DIAGNOSIS — K831 Obstruction of bile duct: Secondary | ICD-10-CM | POA: Diagnosis not present

## 2019-08-04 DIAGNOSIS — Z9049 Acquired absence of other specified parts of digestive tract: Secondary | ICD-10-CM | POA: Diagnosis not present

## 2019-08-04 DIAGNOSIS — Z79899 Other long term (current) drug therapy: Secondary | ICD-10-CM | POA: Diagnosis not present

## 2019-08-09 DIAGNOSIS — Z4803 Encounter for change or removal of drains: Secondary | ICD-10-CM | POA: Diagnosis not present

## 2019-08-09 DIAGNOSIS — K651 Peritoneal abscess: Secondary | ICD-10-CM | POA: Diagnosis not present

## 2019-08-16 DIAGNOSIS — K750 Abscess of liver: Secondary | ICD-10-CM | POA: Diagnosis not present

## 2019-09-05 DIAGNOSIS — S3613XD Injury of bile duct, subsequent encounter: Secondary | ICD-10-CM | POA: Diagnosis not present

## 2019-10-05 DIAGNOSIS — Z4803 Encounter for change or removal of drains: Secondary | ICD-10-CM | POA: Diagnosis not present

## 2019-10-05 DIAGNOSIS — K750 Abscess of liver: Secondary | ICD-10-CM | POA: Diagnosis not present

## 2019-10-06 DIAGNOSIS — Z124 Encounter for screening for malignant neoplasm of cervix: Secondary | ICD-10-CM | POA: Diagnosis not present

## 2019-10-06 DIAGNOSIS — K219 Gastro-esophageal reflux disease without esophagitis: Secondary | ICD-10-CM | POA: Diagnosis not present

## 2019-10-06 DIAGNOSIS — F419 Anxiety disorder, unspecified: Secondary | ICD-10-CM | POA: Diagnosis not present

## 2019-10-23 DIAGNOSIS — Z4682 Encounter for fitting and adjustment of non-vascular catheter: Secondary | ICD-10-CM | POA: Diagnosis not present

## 2019-10-23 DIAGNOSIS — K651 Peritoneal abscess: Secondary | ICD-10-CM | POA: Diagnosis not present

## 2019-10-30 DIAGNOSIS — Z79899 Other long term (current) drug therapy: Secondary | ICD-10-CM | POA: Diagnosis not present

## 2019-10-30 DIAGNOSIS — Z4682 Encounter for fitting and adjustment of non-vascular catheter: Secondary | ICD-10-CM | POA: Diagnosis not present

## 2019-10-30 DIAGNOSIS — K651 Peritoneal abscess: Secondary | ICD-10-CM | POA: Diagnosis not present

## 2019-11-24 DIAGNOSIS — Z4682 Encounter for fitting and adjustment of non-vascular catheter: Secondary | ICD-10-CM | POA: Diagnosis not present

## 2019-11-24 DIAGNOSIS — K750 Abscess of liver: Secondary | ICD-10-CM | POA: Diagnosis not present

## 2019-11-24 DIAGNOSIS — M2518 Fistula, other specified site: Secondary | ICD-10-CM | POA: Diagnosis not present

## 2019-11-24 DIAGNOSIS — K651 Peritoneal abscess: Secondary | ICD-10-CM | POA: Diagnosis not present

## 2020-01-08 DIAGNOSIS — Z1231 Encounter for screening mammogram for malignant neoplasm of breast: Secondary | ICD-10-CM | POA: Diagnosis not present
